# Patient Record
Sex: Female | Born: 1985 | Hispanic: Yes | Marital: Single | Smoking: Never smoker
Health system: Southern US, Community
[De-identification: ages and names within clinical notes are randomized; demographics above are authoritative.]

## PROBLEM LIST (undated history)

## (undated) ENCOUNTER — Inpatient Hospital Stay (HOSPITAL_COMMUNITY): Payer: Self-pay

## (undated) DIAGNOSIS — Z789 Other specified health status: Secondary | ICD-10-CM

## (undated) DIAGNOSIS — R195 Other fecal abnormalities: Secondary | ICD-10-CM

## (undated) DIAGNOSIS — O3430 Maternal care for cervical incompetence, unspecified trimester: Secondary | ICD-10-CM

## (undated) DIAGNOSIS — R109 Unspecified abdominal pain: Secondary | ICD-10-CM

## (undated) DIAGNOSIS — G43909 Migraine, unspecified, not intractable, without status migrainosus: Secondary | ICD-10-CM

## (undated) DIAGNOSIS — R87629 Unspecified abnormal cytological findings in specimens from vagina: Secondary | ICD-10-CM

## (undated) DIAGNOSIS — K625 Hemorrhage of anus and rectum: Secondary | ICD-10-CM

## (undated) DIAGNOSIS — R87619 Unspecified abnormal cytological findings in specimens from cervix uteri: Secondary | ICD-10-CM

## (undated) DIAGNOSIS — Z8619 Personal history of other infectious and parasitic diseases: Secondary | ICD-10-CM

## (undated) DIAGNOSIS — IMO0002 Reserved for concepts with insufficient information to code with codable children: Secondary | ICD-10-CM

## (undated) HISTORY — DX: Unspecified abdominal pain: R10.9

## (undated) HISTORY — DX: Migraine, unspecified, not intractable, without status migrainosus: G43.909

## (undated) HISTORY — DX: Unspecified abnormal cytological findings in specimens from vagina: R87.629

## (undated) HISTORY — DX: Reserved for concepts with insufficient information to code with codable children: IMO0002

## (undated) HISTORY — PX: TONSILLECTOMY: SUR1361

## (undated) HISTORY — PX: DILATION AND CURETTAGE OF UTERUS: SHX78

## (undated) HISTORY — PX: BREAST CYST EXCISION: SHX579

## (undated) HISTORY — DX: Other fecal abnormalities: R19.5

## (undated) HISTORY — DX: Hemorrhage of anus and rectum: K62.5

## (undated) HISTORY — DX: Personal history of other infectious and parasitic diseases: Z86.19

## (undated) HISTORY — PX: CERVICAL BIOPSY: SHX590

## (undated) HISTORY — PX: BREAST SURGERY: SHX581

## (undated) HISTORY — DX: Unspecified abnormal cytological findings in specimens from cervix uteri: R87.619

## (undated) HISTORY — DX: Maternal care for cervical incompetence, unspecified trimester: O34.30

---
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
## 2004-01-24 ENCOUNTER — Ambulatory Visit (HOSPITAL_COMMUNITY): Admission: AD | Admit: 2004-01-24 | Discharge: 2004-01-24 | Payer: Self-pay | Admitting: Obstetrics and Gynecology

## 2004-01-25 ENCOUNTER — Ambulatory Visit (HOSPITAL_COMMUNITY): Admission: AD | Admit: 2004-01-25 | Discharge: 2004-01-25 | Payer: Self-pay | Admitting: Obstetrics and Gynecology

## 2004-01-29 ENCOUNTER — Ambulatory Visit (HOSPITAL_COMMUNITY): Admission: RE | Admit: 2004-01-29 | Discharge: 2004-01-29 | Payer: Self-pay | Admitting: Obstetrics and Gynecology

## 2004-02-12 ENCOUNTER — Inpatient Hospital Stay (HOSPITAL_COMMUNITY): Admission: EM | Admit: 2004-02-12 | Discharge: 2004-02-14 | Payer: Self-pay | Admitting: *Deleted

## 2004-02-14 ENCOUNTER — Ambulatory Visit (HOSPITAL_COMMUNITY): Admission: AD | Admit: 2004-02-14 | Discharge: 2004-02-15 | Payer: Self-pay | Admitting: Obstetrics and Gynecology

## 2004-02-18 HISTORY — PX: CERVICAL CERCLAGE: SHX1329

## 2004-04-03 ENCOUNTER — Ambulatory Visit (HOSPITAL_COMMUNITY): Admission: AD | Admit: 2004-04-03 | Discharge: 2004-04-03 | Payer: Self-pay | Admitting: Obstetrics and Gynecology

## 2004-04-04 ENCOUNTER — Ambulatory Visit (HOSPITAL_COMMUNITY): Admission: AD | Admit: 2004-04-04 | Discharge: 2004-04-04 | Payer: Self-pay | Admitting: Obstetrics & Gynecology

## 2004-04-30 ENCOUNTER — Ambulatory Visit (HOSPITAL_COMMUNITY): Admission: AD | Admit: 2004-04-30 | Discharge: 2004-04-30 | Payer: Self-pay | Admitting: Obstetrics and Gynecology

## 2004-05-07 ENCOUNTER — Ambulatory Visit (HOSPITAL_COMMUNITY): Admission: RE | Admit: 2004-05-07 | Discharge: 2004-05-07 | Payer: Self-pay | Admitting: Obstetrics and Gynecology

## 2004-05-14 ENCOUNTER — Observation Stay (HOSPITAL_COMMUNITY): Admission: AD | Admit: 2004-05-14 | Discharge: 2004-05-15 | Payer: Self-pay | Admitting: Obstetrics and Gynecology

## 2004-05-20 ENCOUNTER — Observation Stay (HOSPITAL_COMMUNITY): Admission: RE | Admit: 2004-05-20 | Discharge: 2004-05-20 | Payer: Self-pay | Admitting: Obstetrics and Gynecology

## 2004-05-23 ENCOUNTER — Ambulatory Visit (HOSPITAL_COMMUNITY): Admission: AD | Admit: 2004-05-23 | Discharge: 2004-05-24 | Payer: Self-pay | Admitting: Obstetrics and Gynecology

## 2004-05-27 ENCOUNTER — Ambulatory Visit (HOSPITAL_COMMUNITY): Admission: RE | Admit: 2004-05-27 | Discharge: 2004-05-27 | Payer: Self-pay | Admitting: Obstetrics & Gynecology

## 2004-05-28 ENCOUNTER — Observation Stay (HOSPITAL_COMMUNITY): Admission: RE | Admit: 2004-05-28 | Discharge: 2004-05-29 | Payer: Self-pay | Admitting: Obstetrics and Gynecology

## 2004-05-30 ENCOUNTER — Ambulatory Visit (HOSPITAL_COMMUNITY): Admission: AD | Admit: 2004-05-30 | Discharge: 2004-05-30 | Payer: Self-pay | Admitting: Obstetrics and Gynecology

## 2004-06-03 ENCOUNTER — Inpatient Hospital Stay (HOSPITAL_COMMUNITY): Admission: AD | Admit: 2004-06-03 | Discharge: 2004-06-05 | Payer: Self-pay | Admitting: Obstetrics and Gynecology

## 2005-03-14 ENCOUNTER — Encounter (INDEPENDENT_AMBULATORY_CARE_PROVIDER_SITE_OTHER): Payer: Self-pay | Admitting: General Surgery

## 2005-03-14 ENCOUNTER — Observation Stay (HOSPITAL_COMMUNITY): Admission: RE | Admit: 2005-03-14 | Discharge: 2005-03-15 | Payer: Self-pay | Admitting: General Surgery

## 2006-01-30 ENCOUNTER — Emergency Department (HOSPITAL_COMMUNITY): Admission: EM | Admit: 2006-01-30 | Discharge: 2006-01-30 | Payer: Self-pay | Admitting: Emergency Medicine

## 2007-07-21 ENCOUNTER — Other Ambulatory Visit: Admission: RE | Admit: 2007-07-21 | Discharge: 2007-07-21 | Payer: Self-pay | Admitting: Obstetrics and Gynecology

## 2009-10-25 ENCOUNTER — Other Ambulatory Visit: Admission: RE | Admit: 2009-10-25 | Discharge: 2009-10-25 | Payer: Self-pay | Admitting: Obstetrics and Gynecology

## 2010-07-05 NOTE — H&P (Signed)
[name], [name]                ACCOUNT NO.:  [id number]   MEDICAL RECORD NO.:  [id number]          PATIENT TYPE:  INP   LOCATION:  LDR3                          FACILITY:  [facility]   PHYSICIAN:  Tilda Burrow, M.D. DATE OF BIRTH:  Jul 10, 1985   DATE OF ADMISSION:  06/03/2004  DATE OF DISCHARGE:  LH                                HISTORY & PHYSICAL   REASON FOR ADMISSION:  Active labor.   HISTORY OF PRESENT ILLNESS:  [name] is a 25 year old gravida 7 para 1,  abortus 5, premature 1, living 1 who was admitted at approximately 7:30 in  active labor.  Medical history is positive for abnormal Paps and incompetent  cervix.  Surgical history is positive for some cosmetic surgery, surgery on  her big toe, tonsils and adenoids, and also a cerclage for this pregnancy.   ALLERGIES:  SHE IS ALLERGIC TO Prudy Feeler.   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  UDS is negative.  Serology is nonreactive.  AFP is within normal  limits.  GBS is also positive.  GC and chlamydia:  I will get the results  and have them forwarded from the office.  I am sure they are negative, but I  will have those sent over to confirm.   PHYSICAL EXAMINATION:  This morning cervix is 4 cm, 100% effaced.  Mucous  membranes are bulging.  Presenting part is 0 station.  Contractions are  every 2 minutes apart.  Fetal heart rate is strong and regular with  accelerations.  No decelerations noted.   PLAN:  We are going to admit and expect vaginal delivery.  Also, start  ampicillin 2 g IV and 1 g q.4 h. for GBS prophylaxis.   MEDICATIONS:  The patient was taking Valtrex for HSV suppression, prenatal  vitamins, and Brethine prior to 36 weeks.      DL/MEDQ  D:  04/54/0981  T:  06/03/2004  Job:  [id number]

## 2010-07-05 NOTE — H&P (Signed)
NAMEAISHIA, [name]                ACCOUNT NO.:  [id number]   MEDICAL RECORD NO.:  [id number]          PATIENT TYPE:  AMB   LOCATION:  DAY                           FACILITY:  [facility]   PHYSICIAN:  Jerolyn Shin C. Katrinka Blazing, M.D.   DATE OF BIRTH:  1985-02-19   DATE OF ADMISSION:  03/13/2005  DATE OF DISCHARGE:  LH                                HISTORY & PHYSICAL   HISTORY OF PRESENT ILLNESS:  [name] is a 25 year old female with  history of chronic hemorrhoid disease.  She has had recurrent episodes of  rectal bleeding.  She has also had episodes of recurrent hemorrhoidal  thrombosis.  She has difficulty with constipation.  She has sought  hemorrhoidectomy and this will be done on January 25.   PAST MEDICAL HISTORY:  No medical illnesses.   MEDICATIONS:  No chronic medications.   PAST SURGICAL HISTORY:  1.  Foot surgery.  2.  Breast surgery for benign disease.   PHYSICAL EXAMINATION:  VITAL SIGNS:  Blood pressure 128/88, pulse 64,  respirations 18, weight 160 pounds.  HEENT:  Unremarkable.  NECK:  Supple.  No JVD, bruit, adenopathy or thyromegaly.  CHEST:  Clear to auscultation.  HEART:  Regular rate and rhythm without murmurs, rubs or gallops.  ABDOMEN:  Soft, nontender, no masses.  RECTAL:  Tight anal sphincter.  Internal and external hemorrhoids.  Apparent  posterior anal fissure.  EXTREMITIES:  No cyanosis, clubbing or edema.  NEUROLOGIC:  No focal motor, sensory or cerebellar deficits.   IMPRESSION:  1.  Hemorrhoid disease.  2.  Posterior anal fissure.   PLAN:  Hemorrhoidectomy and possible internal sphincterotomy.      Dirk Dress. Katrinka Blazing, M.D.  Electronically Signed     LCS/MEDQ  D:  03/13/2005  T:  03/13/2005  Job:  [id number]   cc:   Tilda Burrow, M.D.  Fax: [phone]

## 2010-07-05 NOTE — Consult Note (Signed)
[facility]  Patient:    [name], [name] Visit Number: [id number] MRN: [id number]          Service Type: EMS Location: ED Attending Physician:  Hilario Quarry Dictated by:   Turner Daniels, M.D. Proc. Date: 08/26/01 Admit Date:  08/26/2001 Discharge Date: 08/26/2001                            Consultation Report  HISTORY OF PRESENT ILLNESS:  [name] is a 25 year old African-American female gravida 1, para 0 well known to me from the office.  She had a quantitative hCG a week ago of 97.  It was only in the mid 400s three days ago and I saw her in the office yesterday for a heavy bleeding episode.  My impression was that it was not rising appropriately.  This probably represented a miscarriage or nonviable pregnancy.  She comes in today again complaining of heavy bleeding and cramping and her hCG is 344.  Her H&H is 12.5 and 36.6.  She says the cramping and bleeding has subsided now.  PHYSICAL EXAMINATION  PELVIC:  There is no blood in the vault.  The cervical os is thick, closed, and firm.  The uterus is normal size.  There does not appear to be any infection.  IMPRESSION:  Completed spontaneous abortion.  PLAN:  The patient is given a prescription for Tylox for any ongoing pain and bleeding.  We will see her back in the office on Monday to repeat hCG just to ensure this does not represent an ectopic pregnancy but that does not seem to be the case at the present time with the hCG levels we are dealing with. Talked to her regarding a D&C, but felt like with her cervix being closed and firm and no active bleeding that that would not be in her best interest for future pregnancies.  As a result, she was given a prescription for Tylox and we will see her in the office Monday. Dictated by:   Turner Daniels, M.D. Attending Physician:  Hilario Quarry DD:  08/26/01 TD:  08/30/01 Job: [id number]

## 2010-07-05 NOTE — Op Note (Signed)
[name], [name]                ACCOUNT NO.:  [id number]   MEDICAL RECORD NO.:  [id number]          PATIENT TYPE:  AMB   LOCATION:  DAY                           FACILITY:  [facility]   PHYSICIAN:  Jerolyn Shin C. Katrinka Blazing, M.D.   DATE OF BIRTH:  1985-05-26   DATE OF PROCEDURE:  03/14/2005  DATE OF DISCHARGE:                                 OPERATIVE REPORT   PREOPERATIVE DIAGNOSIS:  Hemorrhoid disease with anal fissure.   POSTOPERATIVE DIAGNOSIS:  Hemorrhoid disease with anal fissure.   PROCEDURE:  Hemorrhoidectomy with internal anal sphincterotomy.   SURGEON:  Dr. Katrinka Blazing.   DESCRIPTION:  Under spinal anesthesia, the patient was placed in prone  position. She was prepped and draped in a sterile field. Digital examination  was carried out. Small sponge was placed in the anal canal. 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 She had large hemorrhoidal complex adjacent to this. She also had  a left anterior hemorrhoidal group with large external hemorrhoid. There was  a very small right lateral hemorrhoidal group that was felt not to be an  operative candidate. The hemorrhoidal tissue in the posterior aspect with  high ligated with 2-0 chromic. Using electrocautery, the bed of the fissure  was excised down to the muscle. Partial internal sphincterotomy was through  the bed of the fissure. Mucosa was then closed using running locking 2-0  chromic. The left anterior group was then evaluated. Apical stitch of 2-0  chromic was placed. The hemorrhoidal complex was excised, taking care not to  injure the sphincter muscle. Hemostasis was achieved, and the mucosa was  closed with running locking 2-0 chromic. It was felt that the tissue did not  need to be excised. Local infiltration with 0.5% Marcaine with epinephrine  was carried out using 30 cc. The patient tolerated the procedure well. The  sponge was removed and accounted for. She was transferred to  a bed and taken  to the post-anesthesia care unit for monitoring.      Dirk Dress. Katrinka Blazing, M.D.  Electronically Signed     LCS/MEDQ  D:  03/14/2005  T:  03/14/2005  Job:  [id number]

## 2010-07-05 NOTE — Discharge Summary (Signed)
[name], [name]                ACCOUNT NO.:  [id number]   MEDICAL RECORD NO.:  [id number]          PATIENT TYPE:  OIB   LOCATION:  A415                          FACILITY:  [facility]   PHYSICIAN:  Tilda Burrow, M.D. DATE OF BIRTH:  1985-11-02   DATE OF ADMISSION:  05/28/2004  DATE OF DISCHARGE:  04/12/2006LH                                 DISCHARGE SUMMARY   REASON FOR ADMISSION:  Observation overnight x 18 hours.   HOSPITAL SUMMARY:  This 25 year old female, gravida 2, para 1, at 34 weeks 6  days, recently status post cerclage removal for suspected cervical  incompetency with long history of preterm labor contractions, was seen in  labor and delivery to rule out early labor.  She was admitted 3 o'clock.  The cervix remained 3 cm. when checked 2 hours later.  Due to transportation  and distance from the hospital and possibility that she might go ahead and  progress, being only two days status post cerclage removal, we kept her  overnight.  Despite ambulation, she did not progress, and contractions  completely ceased.   She is discharged home at 9 a.m. on May 29, 2004, and will follow up in  one week in our office.  She is given prescription for Vicodin.  She already  has Ambien at home.  She will be allowed to labor if she comes in again.      JVF/MEDQ  D:  05/29/2004  T:  05/29/2004  Job:  [id number]

## 2010-07-05 NOTE — H&P (Signed)
NAMESKARLETT, Miranda                ACCOUNT NO.:  [id number]   MEDICAL RECORD NO.:  [id number]          PATIENT TYPE:  OIB   LOCATION:  LDR1                          FACILITY:  [facility]   PHYSICIAN:  Tilda Burrow, M.D. DATE OF BIRTH:  25-Oct-1985   DATE OF ADMISSION:  05/20/2004  DATE OF DISCHARGE:  LH                                HISTORY & PHYSICAL   REASON. FOR ADMISSION:  Pregnancy at 33 weeks and 4 days, premature uterine  contraction.   HISTORY OF PRESENT ILLNESS:  [name] is a 25 year old, gravida 7, para 1 with  an EDC of Jul 04, 2004, with a cerclage in for incompetent cervix, who  presents with regular uterine contractions, 2 to 5 minutes apart at 6:30  a.m.   PAST MEDICAL HISTORY:  Positive for abnormal Paps.   PAST SURGICAL HISTORY:  Positive for some cosmetic surgery, cervical biopsy,  tonsils and adenoids.   ALLERGIES:  She is allergic to __________.   SOCIAL HISTORY:  She is single. Her mother and her boyfriend are present and  supportive.   Prenatal course has been complicated by incompetent cervix, which required a  [employer] procedure cerclage.  HSV 1 and 2 is positive. She is on  suppression at present time, blood type is O positive, UDS is negative. HIV  is nonreactive, HSV, again is positive, serology is nonreactive, GC and  chlamydia are negative. Pap is normal, AFP normal, GBS is positive.   PHYSICAL EXAMINATION:  Cervix is a fingertip, it is long. There is no stress  on the suture in presenting part of fetal heart tone is in the 140s, 150s,  strong and irregular, no decels. The patient has responded well to subcu  terbutaline and contractions have spaced out to irregular.   PLAN:  We are going to consult Dr. Emelda Fear and continue tocolytics.      DL/MEDQ  D:  16/11/9602  T:  05/20/2004  Job:  [id number]   cc:   [facility]

## 2010-07-05 NOTE — H&P (Signed)
[name], [name]                ACCOUNT NO.:  [id number]   MEDICAL RECORD NO.:  [id number]          PATIENT TYPE:  INP   LOCATION:  A415                          FACILITY:  [facility]   PHYSICIAN:  Tilda Burrow, M.D. DATE OF BIRTH:  [birth year]   DATE OF ADMISSION:  02/12/2004  DATE OF DISCHARGE:  LH                                HISTORY & PHYSICAL   ADMISSION DIAGNOSIS:  Pregnancy, 19 weeks, 5 days.  Pelvic pressure.  History of pelvic pressure, rule out cervical incompetence.  Rule out  preterm labor.   HISTORY:  This 25 year old female gravida 7, para 1, AB 5, with one prior  successful pregnancy, delivered at [redacted] weeks gestation last year after a  challenging second trimester, is admitted for the first time to [facility] this pregnancy after presenting for the second time on Christmas  day, complaining of pelvic pressure, sharp lower back pain and mid back dull  ache, like when my period is coming on.  She has been followed through the  pregnancy and to date, the pregnancy has been notable for ultrasound  evaluation of the cervix, which on November 28th suggested a 3.7 cm long  cervix with complete previa suspected.  One week later, the ultrasound  suspected marginal previa with no bleeding having occurred.  She had a  routine fetal survey on February 09, 2004, transabdominal evaluation  suggested a cervix length of 3.3 cm.  Exam today on February 12, 2004  suggested a short cervical length with cervix 1 cm dilated, somewhat tubular  in character.  The possibility of cervical incompetency is considered.   Review of her old prenatal records indicate that she had a cervix length of  1.6 cm at 27 weeks with the last pregnancy.  We were able to maintain the  pregnancy until [redacted] weeks gestation.  She was transferred at least twice to  [facility] for management of the preterm concerns.  Review  of the prior records indicates that she bounced around a lot with  her last  pregnancy, once being seen at [facility], when she was evaluated  there.  She had multiple visits for preterm uterine contractions and  suspected preterm labor.  Cervix remained most of the pregnancy at 1 cm with  dilation of 90-100% effacement, -1 station with posteriorly oriented cervix.  Lower uterine segment was dilated as early as 26 weeks.   PAST MEDICAL HISTORY:  1.  History of abnormal Pap.  2.  She also had a history of overdose early in her pregnancy last year.  3.  She has a history of use of anxiolytics, not currently any being used.   PAST SURGICAL HISTORY:  1.  Third nipple removed.  2.  Tonsillectomy and adenoidectomy.  3.  Big toenail removed.  4.  Cervical biopsy for Pap smear evaluation.   She reports an allergy to [facility].  It is felt that this is perhaps a reaction  to her earlier overdose.   PHYSICAL EXAMINATION:  GENERAL:  A slim, petite Caucasian female alert and  oriented x 3.  She appears to be resting comfortably.  External monitoring  shows several hours of monitoring with no contractions.  With having the  patient turned to her side, the monitor suggests a contraction, but this may  be simply artifact related to the movement.  PELVIC:  Cervical exam shows no bleeding on the glove but shows the cervix  to be 1 cm, 50%, posteriorly oriented cervix, which is quite firm in  texture.   IMPRESSION:  Pregnancy, 19 weeks, 4 days.  Pelvic pressure and back  discomfort suggestive of gynecologic origin.  History suspicious for preterm  labor, possibly a history of preterm cervical changes with last pregnancy.   PLAN:  Will admit today and will obtain transvaginal ultrasound to see if  cervix length has changed (was noted at 3.3 cm on transabdominal scan on  February 09, 2004).  Will give consideration to [employer] cerclage.   ADDENDUM:  Blood type is O+.  Initial urine drug screen negative.  Hemoglobin 13, hematocrit 40.  Hepatitis, HIV, RPR all  negative.  She has a  history of HSV II antibiotics as well as a history of group B strep  positive, prior pregnancy.  Her partner is [employer].  This is her  second child with him.  She plans to breast-feed and bottle-supplement if  the pregnancy is successful.     John   JVF/MEDQ  D:  02/12/2004  T:  02/12/2004  Job:  [id number]

## 2010-07-05 NOTE — H&P (Signed)
[name], [name]                ACCOUNT NO.:  [id number]   MEDICAL RECORD NO.:  [id number]          PATIENT TYPE:  OIB   LOCATION:  A415                          FACILITY:  [facility]   PHYSICIAN:  Lazaro Arms, M.D.   DATE OF BIRTH:  [birth year]   DATE OF ADMISSION:  05/23/2004  DATE OF DISCHARGE:  LH                                HISTORY & PHYSICAL   CHIEF COMPLAINT:  Fall at home, uterine tenderness, uterine contractions.   HISTORY OF PRESENT ILLNESS:  [name] is a gravida 7, para 1, AB 5, living 1,  with an EDC of Jul 04, 2004, based on last menstrual period and first  trimester ultrasound, placing her at 53 weeks' gestation.  [name]  pregnancy has been complicated by cervical incompetency with a [employer]  cerclage placed in her early second trimester.  She has been having preterm  contractions for several months now.  The cerclage is still in place.  History of the night's activities per [name], her mother and her boyfriend  is that [name] was on the couch.  She stood up and fell down, hitting the  right side of her abdomen on the coffee table.  Her mom and boyfriend said  she had jerking motions.  [name]'s mom ran to the kitchen to get a spoon to  put in her mouth because she thought she was having a seizure.  When she got  back, [name] was beginning to wake up.  They carried her to the car and  brought her straight to the hospital.  [name]'s memory of this is standing  up from the couch, and her next memory is being toted to the car by her mom  and her boyfriend.  They state that she was confused the entire way to the  hospital, which was probably five to 10 minutes.  Upon arrival to the unit,  [name] was alert and oriented x3.  She is having regular 60-70 second  uterine contractions about every seven to eight minutes or so.  When she  first came, her abdomen was soft and had minimal pain in between the  contractions, and the fetal heart rate was in the 140s-150s with  average  long-term variability, no qualifying accelerations.  After being here for  about 20 minutes, the fetal heart rate abruptly went to 0 to minimal long-  term variability, and her uterus is irritable in between the contractions.  She was given O2 at 8 L/min. via nonrebreather mask.  Acoustic stimulation  was given, and the heart rate once again developed average long-term  variability.  There are some very mild variable decelerations, not late  decelerations, noted.  There are some 10 x 10 accelerations but still no  qualifying reactive accelerations.  Vital signs are within normal limits.  Blood pressure is not elevated.  Urine dip was negative for everything,  including ketones.  [name] says that she has been eating and drinking a lot  today and going to the bathroom a lot.  Her uterus is tender to touch where  she fell.  She says that  she has just a general feeling of soreness  throughout her uterus.  Her uterus is soft to the touch.  There is no  apparent obvious bruising.   PELVIC EXAMINATION:  The cerclage is in place, although I can palpate more  of the stitch than on previous exams, less cervix.  A sterile speculum exam  did not offer much more information, just because it was difficult to really  visualize.  The cervix looked very short.   IMPRESSION:  1.  Intrauterine pregnancy at 34 weeks.  2.  Syncopal episode versus seizure.  3.  Questionable abruption.  4.  Fetal heart rate at this point remains reassuring.   Dr. Despina Hidden was contacted and given the above information.  The plan is to draw  abruption labs.  Will hold off on terbutaline for the contractions so that  we do not mask any placental injury.  Keep her on the fetal monitor  throughout the night.  She can have Lortab for pain.  Will do a head CT in  the morning and observe her overnight.      FC/MEDQ  D:  05/24/2004  T:  05/24/2004  Job:  [id number]

## 2010-07-05 NOTE — H&P (Signed)
[name], Miranda                ACCOUNT NO.:  [id number]   MEDICAL RECORD NO.:  [id number]          PATIENT TYPE:  OIB   LOCATION:  A415                          FACILITY:  [facility]   PHYSICIAN:  Tilda Burrow, M.D. DATE OF BIRTH:  27-Sep-1985   DATE OF ADMISSION:  05/07/2004  DATE OF DISCHARGE:  LH                                HISTORY & PHYSICAL   HISTORY OF PRESENT ILLNESS:  [name] is a 25 year old, gravida 7, para 1,  abortus 5 who is at 31 weeks and 5 days. EDC is May 18, who presented at 6  a.m. this morning having contractions. States that she was up most of the  night and comfortable, but got really uncomfortable around 3 a.m. and at  that time, did not resolve with bed rest or hydration. At 5 o'clock, decided  to come into the hospital.   PAST MEDICAL HISTORY:  Positive for abnormal Paps.   PAST SURGICAL HISTORY:  Positive for cosmetic surgery, tonsils and adenoids,  cervix biopsy, although she had a cerclage stitch placed in this pregnancy.   ALLERGIES:  She is allergic to [facility].   CURRENT MEDICATIONS:  She is taking prenatal vitamins.   Prenatal course has been complicated by incompetent cervix, which was  cerclaged, was placed on December 27. She has had multiple hospitalizations  for preterm contractions during this pregnancy and she presents today for  same.   PHYSICAL EXAMINATION:  Vital signs are stable. Cervix is closed and long  stitches intact. There is some mild lower uterine incision bulging and she  is having regular contractions.   PLAN:  We are going to give some subcu terbutaline, followed by p.o.  terbutaline. Also, we are going to start her on HSV suppression at present  time due to the probability that she will go into labor before her 36 week  suppression regime. We are going to keep her until lunch time to see if  these contractions stay resolved and plan for her to be discharged on  Brethine 2.5 p.o. q. 4 p.r.n. contractions and Valtrex 500  p.o. q. day. She  is to follow-up in the office Thursday or Friday.      DL/MEDQ  D:  16/11/9602  T:  05/07/2004  Job:  [id number]

## 2010-07-05 NOTE — Discharge Summary (Signed)
   [name], [name]                          ACCOUNT NO.:  [id number]   MEDICAL RECORD NO.:  [id number]                   PATIENT TYPE:  INP   LOCATION:  A417                                 FACILITY:  [facility]   PHYSICIAN:  Lazaro Arms, M.D.                DATE OF BIRTH:  03/11/1985   DATE OF ADMISSION:  10/25/2002  DATE OF DISCHARGE:  10/28/2002                                 DISCHARGE SUMMARY   DISCHARGE DIAGNOSES:  1. Status post magnesium sulfate tocolysis, preterm labor.  2. Intrauterine pregnancy at [redacted] weeks gestation.   HOSPITAL COURSE:  The patient was admitted for recurrent preterm labor.  She  was placed on magnesium sulfate tocolysis for 36 hours.  Also, placed on  Indocin for 48 hours.  She did well with that and had no further change in  her cervix.  No rupture of membranes, no bleeding.  She was placed on  terbutaline 24 hours without significant uterine activity and as result was  discharged home on the morning of October 28, 2002.   FOLLOW UP:  To follow up in the office on Wednesday.   DISCHARGE INSTRUCTIONS:  She will continue to take her terbutaline 5 mg  q.4h.                                                 Lazaro Arms, M.D.    [name]  D:  10/28/2002  T:  10/28/2002  Job:  [id number]

## 2010-07-05 NOTE — H&P (Signed)
   NAME:  [name], [name]                          ACCOUNT NO.:  [id number]   MEDICAL RECORD NO.:  [id number]                   PATIENT TYPE:  OBV   LOCATION:  A414                                 FACILITY:  [facility]   PHYSICIAN:  Lazaro Arms, M.D.                DATE OF BIRTH:  [birth year]   DATE OF ADMISSION:  10/25/2002  DATE OF DISCHARGE:                                HISTORY & PHYSICAL   CHIEF COMPLAINT:  Contractions x several hours.   HISTORY OF PRESENT ILLNESS:  [name] is an 25 year old, gravida 3, para 0, AB  2 with an EDC of January 07, 2003 based on last menstrual period and  correlating ultrasounds placing her at 29 weeks, 3 days gestation. Her  pregnancy course has been complicated by preterm labor with cervical change  at approximately 27 weeks with a cervix 1 cm, 80-90% effaced, -1 to 0  station. She was in fact sent to [facility] anticipating delivery but  was sent home. She has been on p.o. Brethine for approximately a week and a  half and broke through this afternoon. She did not respond to a subcu dose  of terbutaline and in fact felt that the contractions worsened. For this  reason, she was given a 6 g bolus of magnesium sulfate and will be  maintained on 3 g an hour dosage. Her cervix upon admission per RN was  unchanged calling it fingertip 80-90%, -1 to 0 station. Fetal heart rate is  reactive without decelerations at this time. The mag bolus has been  completed for approximately five minutes and contractions are spaced out,  the last two were seven minutes apart. The patient is complaining of slight  tenderness in her right upper quadrant so we will do a CBC and check liver  functions just to be safe.   IMPRESSION:  Intrauterine pregnancy at 29 weeks, 3 days, preterm labor.   PLAN:  Magnesium sulfate tocolysis, rule out hepatic or gallbladder  malfunction. The patient has already received a course of betamethasone.     [name], C.N.M.           Lazaro Arms, M.D.    FC/MEDQ  D:  10/25/2002  T:  10/25/2002  Job:  [id number]

## 2010-07-05 NOTE — Consult Note (Signed)
[name], [name]                ACCOUNT NO.:  [id number]   MEDICAL RECORD NO.:  [id number]          PATIENT TYPE:  OIB   LOCATION:  A415                          FACILITY:  [facility]   PHYSICIAN:  Tilda Burrow, M.D. DATE OF BIRTH:  1985/04/06   DATE OF CONSULTATION:  05/24/2004  DATE OF DISCHARGE:                                   CONSULTATION   [name] was observed throughout the night and was given IV fluids, IV Nubain  x1, some Ambien.  She did calm down in her uterus and her pain.  We  monitored her all night.  She continued to have some uterine irritability  some mild contractions, however nothing major.  Her head CT this morning was  negative.  She had a biophysical profile which was 6/8.  The heart rate  maintained average long-term variability with accelerations present from  time to time.  The fetus was moving around a whole lot.  Placenta looked  fine on ultrasound.   IMPRESSION:  1.  Intrauterine pregnancy at  34 weeks.  2.  Probable syncopal episode.  3.  No evidence of abruption.   We will discharge her home on Lortab 5/500 mg #30 for pain in her side where  she the table and Ambien 10 mg #30 for sleep.  She is going to return on  Monday to the hospital for a biophysical profile before her appointment at  the office.      FC/MEDQ  D:  05/24/2004  T:  05/24/2004  Job:  [id number]   cc:   Lorin Picket A. Gerda Diss, MD  [address]  [place]  [place] [address]  Fax: [phone]

## 2010-07-05 NOTE — Op Note (Signed)
NAMEDEKOTA, KIRLIN                ACCOUNT NO.:  [id number]   MEDICAL RECORD NO.:  [id number]          PATIENT TYPE:  INP   LOCATION:  LDR3                          FACILITY:  [facility]   PHYSICIAN:  Tilda Burrow, M.D. DATE OF BIRTH:  1985-05-22   DATE OF PROCEDURE:  06/03/2004  DATE OF DISCHARGE:                                  PROCEDURE NOTE   ONSET OF LABOR:  June 03, 2004, at 5:30 a.m.   DATE OF DELIVERY:  June 03, 2004, at 8:55 a.m.   LENGTH OF FIRST-STAGE LABOR:  Three hours and 15 minutes.   LENGTH OF SECOND-STAGE LABOR:  Ten minutes.   LENGTH OF THIRD-STAGE LABOR:  Five minutes.   DELIVERY NOTE:  Esli has spontaneous vaginal delivery of a viable female  infant with Apgars of 9 and 9.  Upon delivery of head, shoulders  spontaneously delivered.  Infant was thoroughly suctioned and dried.  Cord  clamped and cut.  Infant to mother's abdomen for newborn care.  Upon  inspection, perineum was noted to be intact.  Cord blood gas was obtained.  Upon inspection, three-vessel cord was noted.  Cord blood was obtained and  sent to the lab.  Third stage of labor was actively managed with 20 units of  Pitocin at a rapid rate.  Placenta delivered via Tomasa Blase mechanism.  Membranes upon inspection are noted to be intact.  Estimated blood loss was  approximately 250 cc.  Infant and mother were stabilized and transferred out  to the postpartum unit in stable condition.      DL/MEDQ  D:  81/19/1478  T:  06/03/2004  Job:  [id number]   cc:   Tilda Burrow, M.D.  [address] [place]  [place] [address]  Fax: [phone]   Francoise Schaumann. Halford Chessman  Fax: [phone]

## 2010-07-05 NOTE — Op Note (Signed)
[name], [name]                ACCOUNT NO.:  [id number]   MEDICAL RECORD NO.:  [id number]          PATIENT TYPE:  OIB   LOCATION:  A415                          FACILITY:  [facility]   PHYSICIAN:  Tilda Burrow, M.D. DATE OF BIRTH:  [birth year]   DATE OF PROCEDURE:  04/30/2004  DATE OF DISCHARGE:  04/30/2004                                 OPERATIVE REPORT   [name] had a three-hour observation on April 30, 2004.  She was having some  mild contractions.  She did not want an injectable terbutaline, so I gave  her p.o. Brethine and held her for two hours after the Brethine, and her  uterine activity stopped.  Cervix was soft but cerclage is in place, and she  is essentially closed.  Fetal heart rate looked fine.   IMPRESSION:  1.  Preterm contractions responding well to terbutaline.  2.  Cerclage.      FC/MEDQ  D:  05/02/2004  T:  05/02/2004  Job:  [id number]

## 2010-07-05 NOTE — Op Note (Signed)
[facility]  Patient:    [name], [name] Visit Number: [id number] MRN: [id number]          Service Type: DSU Location: DAY Attending Physician:  Dalia Heading Dictated by:   Franky Macho, M.D. Proc. Date: 07/26/01 Admit Date:  07/26/2001   CC:         Christin Bach, M.D.   Operative Report  PREOPERATIVE DIAGNOSIS:  Inflamed accessory nipple, left.  POSTOPERATIVE DIAGNOSIS:  Inflamed accessory nipple, left.  PROCEDURE:  Excision of accessory left nipple.  SURGEON:  Franky Macho, M.D.  ANESTHESIA:  General.  INDICATIONS FOR PROCEDURE:  The patient is a 25 year old white female who presents with an irritated accessory left nipple. It is along the inframammary fold on the left side. The patient now comes to the operating room for excision of the accessory left nipple. The risks and benefits of the procedure including bleeding, infection, and recurrence were fully explained to the patients mother who gave informed consent.  DESCRIPTION OF PROCEDURE:  The patient was placed in the supine position. After general anesthesia was administered, the left breast was prepped and draped using the usual sterile technique with Betadine.  An accessory left nipple was noted along the mid clavicular line at the inframammary fold. An elliptical incision was made around the nipple and the nipple along with its ductal subcutaneous contents were excised fully without difficulty. Any bleeding was controlled using Bovie electrocautery. The 0.5% Marcaine was instilled into the surrounding wound. The skin was closed using a 4-0 Vicryl subcuticular suture. Steri-Strips and a dry sterile dressing were applied.  All tape and needle counts were correct at the end of the procedure. The patient was awakened and transferred to PACU in stable condition.  COMPLICATIONS:  None.  SPECIMEN:  Accessory left nipple.  ESTIMATED BLOOD LOSS:  Minimal. Dictated by:   Franky Macho, M.D. Attending Physician:  Dalia Heading DD:  07/26/01 TD:  07/27/01 Job: [id number]

## 2010-07-05 NOTE — Discharge Summary (Signed)
[name], [name]                          ACCOUNT NO.:  [id number]   MEDICAL RECORD NO.:  [id number]                   PATIENT TYPE:  OIB   LOCATION:  A414                                 FACILITY:  [facility]   PHYSICIAN:  Langley Gauss, M.D.                DATE OF BIRTH:  28-Nov-1985   DATE OF ADMISSION:  10/30/2002  DATE OF DISCHARGE:  10/30/2002                                 DISCHARGE SUMMARY   OBSTETRICAL OBSERVATION AND TRANSFER SUMMARY   This 25 year old gravida 3, para 0, first trimester abortus 2 para 0 at [redacted]  weeks gestation who presents to [facility] complaining of  contractions all day long with increasing frequency and intensity during the  hour prior to presentation.  The patient called labor and delivery at which  time she was advised to come in for evaluation. Pertinently the patient had  been attending church services earlier in the day in [place].  With the  onset of uterine contractions she had been transported to [facility] in  [place] by ambulance at which time she was evaluated by a female care  [name] there.  The patient was observed x2 hours duration.  Digital and  sterile speculum examination were performed and the patient was discharged  to home with instructions to follow up with her care [name]. No tocolytics  were administered.  The patient's prenatal course otherwise complicated by  previous hospitalization to [facility] at [redacted] weeks gestation with  preterm labor and premature cervical dilatation.  It appears as though the  patient was dilated about 1 cm.  At that time she did receive 2 doses of IM  betamethasone. The patient subsequently had recurrent preterm labor and was  recently hospitalized at [facility] from September 6 to October 28, 2002 with recurrent preterm labor.  She was discharged to home with what  the patient states are instructions to take p.o. Brethine as needed for  preterm labor. The patient  has not taken any Brethine times several days  duration.   ALLERGIES:  She has no known drug allergies.   PAST MEDICAL HISTORY:  The patient did have a benzodiazepine overdose in  October 2000 requiring an ICU stay.  She had an accessory nipple excised  June 2003.  The patient had previous tonsillectomy.  She does have a history  of HSV, GC, and Chlamydia.  No recurrences during this pregnancy.   SOCIAL HISTORY:  The patient is a nonsmoker, married to a Hotel manager man who  was deployed in [place] and has now returned home but is at [employer] [place].   She is a nonsmoker.   FAMILY HISTORY:  Noncontributory.   PHYSICAL EXAMINATION:  GENERAL: The patient is in no acute distress, but  does appear uncomfortable with uterine contractions on initial examination.  VITAL SIGNS:  Temperature 97.4, pulse 100, respiratory rate 18, blood  pressure 146/81.  HEENT:  Negative.  NECK: No adenopathy. Neck is supple.  Thyroid is nonpalpable.  LUNGS:  Clear.  CARDIOVASCULAR:  Heart is regular rate and rhythm.  ABDOMEN:  Soft and nontender.  A gravid uterus is identified with fundal  height of 30 cm.  Vertex presentation by Leopold's maneuvers.  EXTREMITIES:  Noted to be normal.  PELVIC:  Normal external genitalia.  No lesions or ulcerations identified.  No vaginal bleeding or leakage of fluid.  Cervix 2 cm dilated, 100% effaced,  0 station vertex presentation.  External fetal monitor reveals initial  uterine contractions occurring every 3-4 minutes about 4-5 seconds duration.  Fetal heart rate noted to be in the 150s with accelerations.  No fetal heart  rate decelerations are noted.   ASSESSMENT:  A 30 weeks intrauterine pregnancy, recurrent preterm labor and  premature cervical dilatation.   PLAN:  The patient have tocolysis aggressively at this time.  If stabilized  we will arrange for transport to our facility with a level 3 nursery  availability.  The patient adamantly declines  transfer or evaluation to  [facility] in [place], and prefers [facility].  The patient is  to be treated with betamethasone 12 mg IM.  This will be repeated as she is  at high risk for delivery within the next week and empirically also given  ampicillin 2 gm IV.  After the initial dose of subcu terbutaline the patient  will have tocolysis with magnesium sulfate, started with a 6 gm IV loading  dose followed by 3 grams per hour x2 hours followed by maintenance of 2 gm  per hour.  The patient was noted to have near complete cessation of uterine  activity after the initial subcu injection of subcu terbutaline.  Thus, I  contacted [facility], talked with Dr. Margot Ables and arrangements are  made for transfer by emergent EMS.  During the evaluation period and  awaiting transfer the patient did have some recurrence of uterine activity  requiring a second and third dose of subcu terbutaline.  A third dose  administered just prior to removal of the floor.                                               Langley Gauss, M.D.    DC/MEDQ  D:  10/31/2002  T:  10/31/2002  Job:  [id number]   cc:   [facility]

## 2010-07-05 NOTE — Consult Note (Signed)
NAMECARREEN, MILIUS                ACCOUNT NO.:  [id number]   MEDICAL RECORD NO.:  [id number]          PATIENT TYPE:  OIB   LOCATION:  A415                          FACILITY:  [facility]   PHYSICIAN:  Tilda Burrow, M.D. DATE OF BIRTH:  [birth year]   DATE OF CONSULTATION:  05/30/2004  DATE OF DISCHARGE:  05/30/2004                                   CONSULTATION   HISTORY OF PRESENT ILLNESS:  Kaedyn came into labor and delivery on May 28, 2004 with complaints of contractions.  She was contracting irregularly.  Her cervix was 3 cm, 50% effaced, -1 station.  Fetal heart rate was  reactive.  Starlena refused tocolytics.  She did have her cerclage removed on  May 27, 2004 by Dr. Emelda Fear.  We will plan to observe for labor with  absolutely no plans for augmenting this process. This was discussed with  Kendal Hymen and she is aware of that.  Tocolytics were recommended due to her  early gestational age.  However, as mentioned before, she has refused them.  Dr. Despina Hidden is aware of her being in the hospital.  If it does look like she is  in labor, we will contact the pediatrician to see whether or not she needs  to be transferred to another facility to deliver.  At this time we will just  plan to observe her.  She has been in the hospital now for three or four  hours with no cervical change, so at this point it does not look like  anything is going to be happening, but we will keep her overnight  nonetheless.      FC/MEDQ  D:  05/30/2004  T:  05/30/2004  Job:  [id number]

## 2010-07-05 NOTE — H&P (Signed)
NAMEJANIEL, [name]                ACCOUNT NO.:  [id number]   MEDICAL RECORD NO.:  [id number]          PATIENT TYPE:  OIB   LOCATION:  LDR1                          FACILITY:  [facility]   PHYSICIAN:  Tilda Burrow, M.D. DATE OF BIRTH:  Jan 09, 1986   DATE OF ADMISSION:  05/14/2004  DATE OF DISCHARGE:  03/29/2006LH                                HISTORY & PHYSICAL   TIME OF OBSERVATION ADMISSION:  2105 hours.   HISTORY OF PRESENT ILLNESS:  [name] is a 25 year old gravida 7, para 1,  abortus 5 with a history of fetal losses, who presents at 32 weeks with  cramping and contractions that are unresolved by bed rest and hydration.   PAST MEDICAL HISTORY:  Positive for abnormal Paps.   PAST SURGICAL HISTORY:  Positive for some cosmetic surgery, tonsils and  adenoids and a biopsy of the cervix.   ALLERGIES:  She is allergic to Xanax.   SOCIAL HISTORY:  She is single.   Prenatal course has been complicated by numerous admissions and also  premature cervical dilatation and incompetent cervix, which required a  cerclage being placed.  Blood type is O+.  UDS is negative.  HIV is  nonreactive.  Hepatitis B is negative.  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  Serology is nonreactive.  GBS is also  positive.   PHYSICAL EXAMINATION:  GENERAL:  With one shot of subcu terbutaline, the  patient slept during most of the course of the night without contractions.  With review of her fetal monitoring strip, there is an occasional isolated  contraction that she is unaware of.  She states she does feel better this  morning.  PELVIS:  Cervical exam reveals the cervix is still intact with stitch  intact.  There is no stress on the stitch.  The cervix is closed.  There is  no bulging of the lower uterine segment, presenting part is high in the  pelvis.   PLAN:  Consulted Dr. Emelda Fear.  He was in to see the patient and talk with  the patient.  We are going to discharge her  home.  She is to follow up in  the office in the morning for her second dose of betamethasone, and she  knows she is to follow up with [place] p.r.n. any uterine contractions.      DL/MEDQ  D:  47/82/9562  T:  05/15/2004  Job:  [id number]   cc:   Tilda Burrow, M.D.  [address] [place]  [place] [address]  Fax: [phone]

## 2010-10-29 ENCOUNTER — Other Ambulatory Visit (HOSPITAL_COMMUNITY)
Admission: RE | Admit: 2010-10-29 | Discharge: 2010-10-29 | Disposition: A | Discharge status: Home / Self Care | Payer: Medicaid Other | Source: Ambulatory Visit | Attending: Obstetrics and Gynecology | Admitting: Obstetrics and Gynecology

## 2010-10-29 DIAGNOSIS — Z01419 Encounter for gynecological examination (general) (routine) without abnormal findings: Secondary | ICD-10-CM | POA: Insufficient documentation

## 2010-10-29 DIAGNOSIS — R87810 Cervical high risk human papillomavirus (HPV) DNA test positive: Secondary | ICD-10-CM | POA: Insufficient documentation

## 2011-02-18 HISTORY — PX: ECTOPIC PREGNANCY SURGERY: SHX613

## 2011-03-10 ENCOUNTER — Ambulatory Visit: Admit: 2011-03-10 | Payer: Self-pay | Admitting: Obstetrics and Gynecology

## 2011-03-10 ENCOUNTER — Encounter (HOSPITAL_COMMUNITY): Payer: Self-pay | Admitting: Anesthesiology

## 2011-03-10 ENCOUNTER — Encounter (HOSPITAL_COMMUNITY): Payer: Self-pay

## 2011-03-10 ENCOUNTER — Emergency Department (HOSPITAL_COMMUNITY): Payer: Medicaid Other | Admitting: Anesthesiology

## 2011-03-10 ENCOUNTER — Encounter (HOSPITAL_COMMUNITY): Admission: EM | Disposition: A | Discharge status: Home / Self Care | Payer: Self-pay | Source: Home / Self Care

## 2011-03-10 ENCOUNTER — Ambulatory Visit (HOSPITAL_COMMUNITY)
Admission: EM | Admit: 2011-03-10 | Discharge: 2011-03-10 | Disposition: A | Discharge status: Home / Self Care | Payer: Medicaid Other | Attending: Obstetrics and Gynecology | Admitting: Obstetrics and Gynecology

## 2011-03-10 ENCOUNTER — Other Ambulatory Visit: Payer: Self-pay | Admitting: Obstetrics and Gynecology

## 2011-03-10 DIAGNOSIS — Z01812 Encounter for preprocedural laboratory examination: Secondary | ICD-10-CM | POA: Insufficient documentation

## 2011-03-10 DIAGNOSIS — O00109 Unspecified tubal pregnancy without intrauterine pregnancy: Secondary | ICD-10-CM | POA: Diagnosis present

## 2011-03-10 HISTORY — PX: LAPAROSCOPY FOR ECTOPIC PREGNANCY: SUR765

## 2011-03-10 LAB — CBC
Hemoglobin: 12.8 g/dL (ref 12.0–15.0)
MCH: 30 pg (ref 26.0–34.0)
MCHC: 33.2 g/dL (ref 30.0–36.0)
MCV: 90.4 fL (ref 78.0–100.0)
RDW: 12.9 % (ref 11.5–15.5)

## 2011-03-10 SURGERY — SALPINGECTOMY, UNILATERAL, LAPAROSCOPIC
Anesthesia: General | Laterality: Left | Wound class: Clean

## 2011-03-10 MED ORDER — ROCURONIUM BROMIDE 100 MG/10ML IV SOLN
INTRAVENOUS | Status: DC | PRN
  Administered 2011-03-10: 5 mg via INTRAVENOUS
  Administered 2011-03-10: 35 mg via INTRAVENOUS

## 2011-03-10 MED ORDER — MIDAZOLAM HCL 5 MG/5ML IJ SOLN
INTRAMUSCULAR | Status: DC | PRN
  Administered 2011-03-10: 2 mg via INTRAVENOUS

## 2011-03-10 MED ORDER — LIDOCAINE HCL (CARDIAC) 10 MG/ML IV SOLN
INTRAVENOUS | Status: DC | PRN
  Administered 2011-03-10: 10 mg via INTRAVENOUS

## 2011-03-10 MED ORDER — NEOSTIGMINE METHYLSULFATE 1 MG/ML IJ SOLN
INTRAMUSCULAR | Status: DC | PRN
  Administered 2011-03-10: 2 mg via INTRAVENOUS

## 2011-03-10 MED ORDER — FENTANYL CITRATE 0.05 MG/ML IJ SOLN
INTRAMUSCULAR | Status: DC | PRN
  Administered 2011-03-10 (×2): 50 ug via INTRAVENOUS
  Administered 2011-03-10 (×2): 25 ug via INTRAVENOUS
  Administered 2011-03-10: 100 ug via INTRAVENOUS

## 2011-03-10 MED ORDER — SODIUM CHLORIDE 0.9 % IR SOLN
Status: DC | PRN
  Administered 2011-03-10: 3000 mL

## 2011-03-10 MED ORDER — LIDOCAINE-EPINEPHRINE (PF) 1 %-1:200000 IJ SOLN
INTRAMUSCULAR | Status: DC | PRN
  Administered 2011-03-10: 10 mL

## 2011-03-10 MED ORDER — GLYCOPYRROLATE 0.2 MG/ML IJ SOLN
INTRAMUSCULAR | Status: DC | PRN
  Administered 2011-03-10: .4 mg via INTRAVENOUS

## 2011-03-10 MED ORDER — LACTATED RINGERS IV SOLN
INTRAVENOUS | Status: DC | PRN
  Administered 2011-03-10: 19:00:00 via INTRAVENOUS

## 2011-03-10 MED ORDER — ONDANSETRON HCL 4 MG/2ML IJ SOLN
INTRAMUSCULAR | Status: DC | PRN
  Administered 2011-03-10: 4 mg via INTRAVENOUS

## 2011-03-10 MED ORDER — PROPOFOL 10 MG/ML IV EMUL
INTRAVENOUS | Status: DC | PRN
  Administered 2011-03-10: 20 mg via INTRAVENOUS
  Administered 2011-03-10: 150 mg via INTRAVENOUS

## 2011-03-10 MED ORDER — 0.9 % SODIUM CHLORIDE (POUR BTL) OPTIME
TOPICAL | Status: DC | PRN
  Administered 2011-03-10: 1000 mL

## 2011-03-10 SURGICAL SUPPLY — 40 items
BAG HAMPER (MISCELLANEOUS) ×1 IMPLANT
BLADE SURG SZ11 CARB STEEL (BLADE) ×1 IMPLANT
CATH FOLEY 2WAY SLVR  5CC 14FR (CATHETERS) ×1
CATH FOLEY 2WAY SLVR 5CC 14FR (CATHETERS) IMPLANT
CLOTH BEACON ORANGE TIMEOUT ST (SAFETY) ×1 IMPLANT
COVER LIGHT HANDLE STERIS (MISCELLANEOUS) ×2 IMPLANT
DRESSING COVERLET 3X1 FLEXIBLE (GAUZE/BANDAGES/DRESSINGS) ×3 IMPLANT
ELECT REM PT RETURN 9FT ADLT (ELECTROSURGICAL) ×2
ELECTRODE REM PT RTRN 9FT ADLT (ELECTROSURGICAL) IMPLANT
GLOVE BIOGEL PI IND STRL 7.0 (GLOVE) IMPLANT
GLOVE BIOGEL PI INDICATOR 7.0 (GLOVE) ×2
GLOVE INDICATOR STER SZ 9 (GLOVE) ×1 IMPLANT
GLOVE SS PI 9.0 STRL (GLOVE) ×1 IMPLANT
GOWN STRL REIN 3XL LVL4 (GOWN DISPOSABLE) ×1 IMPLANT
GOWN STRL REIN XL XLG (GOWN DISPOSABLE) ×2 IMPLANT
INST SET LAPROSCOPIC GYN AP (KITS) ×1 IMPLANT
IV NS IRRIG 3000ML ARTHROMATIC (IV SOLUTION) ×1 IMPLANT
KIT ROOM TURNOVER AP CYSTO (KITS) ×1 IMPLANT
MANIFOLD NEPTUNE II (INSTRUMENTS) ×1 IMPLANT
NDL HYPO 18GX1.5 BLUNT FILL (NEEDLE) IMPLANT
NDL INSUFFLATION 14GA 120MM (NEEDLE) IMPLANT
NEEDLE HYPO 18GX1.5 BLUNT FILL (NEEDLE) ×2 IMPLANT
NEEDLE INSUFFLATION 14GA 120MM (NEEDLE) ×2 IMPLANT
NS IRRIG 1000ML POUR BTL (IV SOLUTION) ×1 IMPLANT
PACK PERI GYN (CUSTOM PROCEDURE TRAY) ×1 IMPLANT
SET BASIN LINEN APH (SET/KITS/TRAYS/PACK) ×1 IMPLANT
SET TUBE IRRIG SUCTION NO TIP (IRRIGATION / IRRIGATOR) ×1 IMPLANT
SOL PREP PROV IODINE SCRUB 4OZ (MISCELLANEOUS) ×1 IMPLANT
SOLUTION ANTI FOG 6CC (MISCELLANEOUS) ×1 IMPLANT
STRIP CLOSURE SKIN 1/2X4 (GAUZE/BANDAGES/DRESSINGS) ×1 IMPLANT
STRIP CLOSURE SKIN 1/4X3 (GAUZE/BANDAGES/DRESSINGS) ×1 IMPLANT
SUT VICRYL 0 UR6 27IN ABS (SUTURE) ×1 IMPLANT
SUT VICRYL AB 3-0 FS1 BRD 27IN (SUTURE) ×1 IMPLANT
SYR BULB IRRIGATION 50ML (SYRINGE) ×1 IMPLANT
TRAY FOLEY BAG SILVER LF 14FR (CATHETERS) ×1 IMPLANT
TROCAR Z-THRD FIOS HNDL 11X100 (TROCAR) ×1 IMPLANT
TROCAR Z-THREAD FIOS 5X100MM (TROCAR) ×1 IMPLANT
TROCAR Z-THREAD SLEEVE 11X100 (TROCAR) ×1 IMPLANT
TUBING INSUFFLATION HIGH FLOW (TUBING) ×1 IMPLANT
WARMER LAPAROSCOPE (MISCELLANEOUS) ×1 IMPLANT

## 2011-03-10 NOTE — ED Notes (Signed)
Dr. Ferguson at bedside. 

## 2011-03-10 NOTE — H&P (Signed)
[name] is an 26 y.o. female. She is admitted for a suture left linear salpingostomy to remove the left ectopic pregnancy. The ectopic was diagnosis afternoon in the office where ultrasound shows in ampullary ectopic pregnancy with 5 mm fetal pole with heartbeat. Treatment options have been discussed including methotrexate versus surgical regimens. We have agreed that failure rate for methotrexate is considered high and therefore recommended surgical treatment. Plans are to try to salvage the left tube. The patient is aware that linear salpingostomy is not always effective in on occasion the left tube Must  be removed.  Pertinent Gynecological History: Blood transfusions: none Sexually transmitted diseases: no past history Previous GYN Procedures:    Last pap: normal Date: 10/2009 OB History: G4, P3   Menstrual History: Menarche age : LMP 01/30/2011 No LMP recorded.    No past medical history on file.  No past surgical history on file.  No family history on file.  Social History:  does not have a smoking history on file. She does not have any smokeless tobacco history on file. Her alcohol and drug histories not on file.  Allergies: Allergies not on file   (Not in a hospital admission)  ROS patient has noted dull left lower quadrant pain like something was causing pressure symptoms, normal bowel function  There were no vitals taken for this visit.  Physical Examination: General appearance - alert, well appearing, and in no distress, oriented to person, place, and time, normal appearing weight, anxious and in mild to moderate distress Mouth - mucous membranes moist, pharynx normal without lesions and dental hygiene good Chest - clear to auscultation, no wheezes, rales or rhonchi, symmetric air entry Heart - normal rate and regular rhythm Abdomen - tenderness noted in the left lower quadrant, mild intensity, no rebound Pelvic - VULVA: normal appearing vulva with no masses,  tenderness or lesions, VAGINA: normal appearing vagina with normal color and discharge, no lesions, CERVIX: Evidence of prior LEEP conization of the cervix, UTERUS: anteverted, mobile, as judged by ultrasound, no bimanual exam ADNEXA: normal adnexa in size, nontender and no masses, tenderness left, with no pelvic fluid. Ultrasound shows a live ectopic pregnancy in the distal left tube Legs normal Physical Exam  No results found for this or any previous visit (from the past 24 hour(s)).  No results found. CBC ordered pregnancy test positive last week in the office at 1500  Assessment/Plan:Unruptured left ectopic pregnancy ampullary, with living fetal pole scheduled for laparoscopic linear salpingostomy possible left salpingectomy this evening at 6:30 PM. Patient is n.p.o. having last eaten food at 12:30 PM  technical aspects of procedure reviewed briefly with patient consent obtained verbally to be documented upon return to ED tonight at [facility] 03/10/2011, 4:29 PM

## 2011-03-10 NOTE — Anesthesia Preprocedure Evaluation (Signed)
Anesthesia Evaluation  Patient identified by MRN, date of birth, ID band Patient awake    Reviewed: Allergy & Precautions, H&P , NPO status , Patient's Chart, lab work & pertinent test results  Airway Mallampati: I TM Distance: >3 FB Neck ROM: Full    Dental No notable dental hx. (+) Teeth Intact   Pulmonary neg pulmonary ROS,    Pulmonary exam normal       Cardiovascular neg cardio ROS     Neuro/Psych Negative Neurological ROS     GI/Hepatic   Endo/Other    Renal/GU      Musculoskeletal   Abdominal Normal abdominal exam  (+)  Abdomen: soft.    Peds  Hematology   Anesthesia Other Findings   Reproductive/Obstetrics (+) Pregnancy                           Anesthesia Physical Anesthesia Plan  ASA: I and Emergent  Anesthesia Plan: General   Post-op Pain Management:    Induction: Rapid sequence and Cricoid pressure planned  Airway Management Planned: Oral ETT  Additional Equipment:   Intra-op Plan:   Post-operative Plan: Extubation in OR  Informed Consent: I have reviewed the patients History and Physical, chart, labs and discussed the procedure including the risks, benefits and alternatives for the proposed anesthesia with the patient or authorized representative who has indicated his/her understanding and acceptance.   Dental advisory given  Plan Discussed with: Anesthesiologist  Anesthesia Plan Comments:         Anesthesia Quick Evaluation

## 2011-03-10 NOTE — Anesthesia Postprocedure Evaluation (Signed)
Anesthesia Post Note  Patient: [name]  Procedure(s) Performed:  LAPAROSCOPIC UNILATERAL SALPINGECTOMY  Anesthesia type: General  Patient location: PACU  Post pain: Pain level controlled  Post assessment: Post-op Vital signs reviewed, Patient's Cardiovascular Status Stable, Respiratory Function Stable, Patent Airway, No signs of Nausea or vomiting and Pain level controlled  Last Vitals:  Filed Vitals:   03/10/11 2030  BP: 130/66  Pulse: 65  Temp:   Resp: 16    Post vital signs: Reviewed and stable  Level of consciousness: awake and alert   Complications: No apparent anesthesia complications

## 2011-03-10 NOTE — Transfer of Care (Signed)
Immediate Anesthesia Transfer of Care Note  Patient: [name]  Procedure(s) Performed:  LAPAROSCOPIC UNILATERAL SALPINGECTOMY  Patient Location: PACU  Anesthesia Type: General  Level of Consciousness: awake  Airway & Oxygen Therapy: Patient Spontanous Breathing and non-rebreather face mask  Post-op Assessment: Report given to PACU RN, Post -op Vital signs reviewed and stable and Patient moving all extremities  Post vital signs: Reviewed and stable  Complications: No apparent anesthesia complications

## 2011-03-10 NOTE — Anesthesia Postprocedure Evaluation (Signed)
Anesthesia Post Note  Patient: [name]  Procedure(s) Performed:  LAPAROSCOPIC UNILATERAL SALPINGECTOMY  Anesthesia type: General  Patient location: PACU  Post pain: Pain level controlled  Post assessment: Post-op Vital signs reviewed, Patient's Cardiovascular Status Stable, Respiratory Function Stable, Patent Airway, No signs of Nausea or vomiting and Pain level controlled  Last Vitals:  Filed Vitals:   03/10/11 2017  BP: 132/66  Pulse: 74  Temp: 36.6 C  Resp: 19    Post vital signs: Reviewed and stable  Level of consciousness: awake and alert   Complications: No apparent anesthesia complications

## 2011-03-10 NOTE — Op Note (Signed)
See detailed dictation included in brief op note.

## 2011-03-10 NOTE — Brief Op Note (Addendum)
03/10/2011  8:17 PM  PATIENT:  [name]  26 y.o. female  PRE-OPERATIVE DIAGNOSIS:  Ectopic Pregnancy Left  POST-OPERATIVE DIAGNOSIS:  Left Ectopic Pregnancy   PROCEDURE:  Procedure(s): LAPAROSCOPIC UNILATERAL SalpingOSTOMY  SURGEON:  Surgeon(s): Tilda Burrow, MD  PHYSICIAN ASSISTANT:   ASSISTANTS: kENDRICK CST   ANESTHESIA:   local and general  EBL:  Total I/O In: 850 [I.V.:850] Out: 325 [Urine:300; Blood:25]  BLOOD ADMINISTERED:none  DRAINS: Urinary Catheter (Foley)   LOCAL MEDICATIONS USED:  LIDOCAINE 10CC  SPECIMEN:  Source of Specimen:  left tubal CONTENTS  DISPOSITION OF SPECIMEN:  PATHOLOGY  COUNTS:  YES  TOURNIQUET:  * No tourniquets in log *  DICTATION: .Dragon Dictation  PLAN OF CARE: Discharge to home after PACU  PATIENT DISPOSITION:  PACU - hemodynamically stable.   Delay start of Pharmacological VTE agent (>24hrs) due to surgical blood loss or risk of bleeding:  {YES/NO/NOT APPLICABLE:20182  -Details of procedure: Patient was taken to the operating room prepped and draped for combined abdominal and vaginal procedure with Foley catheter inserted. Decision was made not to use the uterine manipulator. An infraumbilical vertical 1 cm skin incision was made transverse suprapubic sensation similar length, and right lower quadrant 5 mm incision made Veress needle was introduced through the umbilicus pneumoperitoneum achieved under 7 mm pressure after water droplet test confirmed intraperitoneal location. Pneumoperitoneum was followed by direct insertion of the 11 mm laparoscopic trocar through the umbilicus under drug visualization and placement of cephalic trochars directly visualized from the intraperitoneal camera.  Tension was directed to the pelvis where the left tube was identified is grossly distended in the ampullary portion. The IP ligament on the left was infiltrated with lidocaine with epinephrine the distended ectopic infiltrated as well. An  antimesenteric incision was made in the surface of the ectopic and then scopic manipulator placed inside tube and related to the ectopic tissue and clot. The specimen was grasped with laparoscopic graspers placed in the laparoscopic Endo Catch bag and extracted. Irrigation device was then used in generously irrigated the center of the tube and some small remnants of additional ectopic tissue were extracted by this process and collected for specimen. Pelvis was copiously irrigated. 2 small bleeding areas on the ectopic that required point cautery to achieve adequate hemostasis. Pelvis was inspected and confirmed as hemostatic laparoscopic instruments removed the fascia closed at the suprapubic, and umbilical site, with 0 Vicryl and subcuticular 3-0 Vicryl used to close the skin. Steri-Strips were applied. Estimated blood loss 25 cc condition to recovery in stable Review of old records in the office showed blood type is O+.

## 2011-03-10 NOTE — ED Notes (Signed)
Pt reports is pregnant and had been spotting.  Reports found out today she has an ectopic pregnancy.  Pt was sent here by Dr. Emelda Fear.  C/O pressure in pelvic area.  Denies abd pain.

## 2011-03-10 NOTE — Anesthesia Procedure Notes (Signed)
Procedure Name: Intubation Date/Time: 03/10/2011 7:02 PM Performed by: Minerva Areola Pre-anesthesia Checklist: Patient identified, Patient being monitored, Timeout performed, Emergency Drugs available and Suction available Patient Re-evaluated:Patient Re-evaluated prior to inductionOxygen Delivery Method: Circle System Utilized Preoxygenation: Pre-oxygenation with 100% oxygen Intubation Type: Rapid sequence and Cricoid Pressure applied Ventilation: Mask ventilation without difficulty Laryngoscope Size: Miller and 2 Grade View: Grade I Tube type: Oral Tube size: 7.0 mm Number of attempts: 1 Airway Equipment and Method: stylet Placement Confirmation: ETT inserted through vocal cords under direct vision,  positive ETCO2 and breath sounds checked- equal and bilateral Secured at: 21 cm Tube secured with: Tape Dental Injury: Teeth and Oropharynx as per pre-operative assessment

## 2011-03-21 ENCOUNTER — Inpatient Hospital Stay (HOSPITAL_COMMUNITY)
Admission: AD | Admit: 2011-03-21 | Discharge: 2011-03-21 | Disposition: A | Discharge status: Home / Self Care | Payer: Medicaid Other | Source: Ambulatory Visit | Attending: Obstetrics and Gynecology | Admitting: Obstetrics and Gynecology

## 2011-03-21 ENCOUNTER — Encounter (HOSPITAL_COMMUNITY): Payer: Self-pay | Admitting: *Deleted

## 2011-03-21 DIAGNOSIS — O00109 Unspecified tubal pregnancy without intrauterine pregnancy: Secondary | ICD-10-CM | POA: Insufficient documentation

## 2011-03-21 HISTORY — DX: Other specified health status: Z78.9

## 2011-03-21 LAB — DIFFERENTIAL
Basophils Relative: 1 % (ref 0–1)
Lymphocytes Relative: 43 % (ref 12–46)
Lymphs Abs: 2.5 10*3/uL (ref 0.7–4.0)
Monocytes Relative: 12 % (ref 3–12)
Neutro Abs: 2.5 10*3/uL (ref 1.7–7.7)
Neutrophils Relative %: 43 % (ref 43–77)

## 2011-03-21 LAB — BUN: BUN: 9 mg/dL (ref 6–23)

## 2011-03-21 LAB — CBC
Hemoglobin: 12.5 g/dL (ref 12.0–15.0)
RBC: 4.2 MIL/uL (ref 3.87–5.11)
WBC: 5.8 10*3/uL (ref 4.0–10.5)

## 2011-03-21 LAB — HCG, QUANTITATIVE, PREGNANCY: hCG, Beta Chain, Quant, S: 234 m[IU]/mL — ABNORMAL HIGH (ref <5)

## 2011-03-21 LAB — CREATININE, SERUM
Creatinine, Ser: 0.67 mg/dL (ref 0.50–1.10)
GFR calc Af Amer: >90 mL/min (ref >90)
GFR calc non Af Amer: >90 mL/min (ref >90)

## 2011-03-21 MED ORDER — METHOTREXATE INJECTION FOR WOMEN'S HOSPITAL
50.0000 mg/m2 | Freq: Once | INTRAMUSCULAR | Status: DC
  Filled 2011-03-21: qty 2

## 2011-03-21 NOTE — ED Provider Notes (Signed)
History    Residual ectopic pregnancy tissue after linear salpingostomy. [name] underwent linear salpingostomy at [facility] 12 days prior to this evaluation for an ectopic pregnancy that included a fetal pole and fetal heart motion. Since then she's had gradual decline of her quantitative hCGs but a unsatisfactory rate. Quantitative hCGs have declined to 800, then 425, and yesterday 285. Patient continues to feel nauseated as if she were pregnant, and has not had a residual menstrual slough after the ectopic. We have discussed options of continued monitoring of gradual decline , versus treating for suspected residual ectopic tissue; Patient desires to proceed toward treatment with Methotrexate.    No chief complaint on file.  HPI  Pertinent Gynecological History: Menses: Status post recent ectopic Bleeding: None at present Contraception: none pregnant/ectopic DES exposure: denies Blood transfusions: none Sexually transmitted diseases: no past history Previous GYN Procedures: Linear salpingostomy    No past medical history on file.  Past Surgical History  Procedure Date  . Tonsillectomy   . Cervical biopsy   . Breast surgery     No family history on file.  History  Substance Use Topics  . Smoking status: Never Smoker   . Smokeless tobacco: Not on file  . Alcohol Use: No    Allergies:  Allergies  Allergen Reactions  . Xanax     UNKNOWN REACTION: Placed in Hospital  . Latex     Contraceptive    Prescriptions prior to admission  Medication Sig Dispense Refill  . folic acid (FOLVITE) 1 MG tablet Take 1 mg by mouth daily.      . Multiple Vitamin (MULITIVITAMIN WITH MINERALS) TABS Take 1 tablet by mouth daily.        ROS Physical Exam   There were no vitals taken for this visit.  Physical ExamPhysical Examination: General appearance - alert, well appearing, and in no distress Abdomen - soft, nontender, nondistended, no masses or organomegaly scars from  previous incisions well healing Pelvic - examination not indicated Extremities - peripheral pulses normal, no pedal edema, no clubbing or cyanosis, Homan's sign negative bilaterally Labs: Q-hcg 234.  Approximately 20% drop in levels in 24 hours.  After further discussion, pt desires to be followed serially.  Pt emphatically notes NO lq pain. Pt reports she did have heavy menses with tissue passage 2 days after salpingostomy. MAU Course  Procedures  MDM   Assessment and Plan  S/p Ectopic treated with Methotrexate, slow resolution Plan: keep appt 03/24/11 for weekly qhcg at [facility] V 03/21/2011, 6:20 PM

## 2011-03-21 NOTE — Progress Notes (Signed)
Pt states she still has nausea & breast tenderness.  "I still feel like I'm pregnant."

## 2011-03-21 NOTE — H&P (Signed)
[name] is an 26 y.o. female. She is admitted for a suture left linear salpingostomy to remove the left ectopic pregnancy. The ectopic was diagnosis afternoon in the office where ultrasound shows in ampullary ectopic pregnancy with 5 mm fetal pole with heartbeat. Treatment options have been discussed including methotrexate versus surgical regimens. We have agreed that failure rate for methotrexate is considered high and therefore recommended surgical treatment. Plans are to try to salvage the left tube. The patient is aware that linear salpingostomy is not always effective in on occasion the left tube Must  be removed.  Pertinent Gynecological History: Blood transfusions: none Sexually transmitted diseases: no past history Previous GYN Procedures:    Last pap: normal Date: 10/2009 OB History: G4, P3   Menstrual History: Menarche age : LMP 01/30/2011 No LMP recorded. Patient is pregnant.    History reviewed. No pertinent past medical history.  Past Surgical History  Procedure Date  . Tonsillectomy   . Cervical biopsy   . Breast surgery     No family history on file.  Social History:  reports that she has never smoked. She does not have any smokeless tobacco history on file. She reports that she does not drink alcohol or use illicit drugs.  Allergies:  Allergies  Allergen Reactions  . Xanax     UNKNOWN REACTION: Placed in Hospital  . Latex     Contraceptive    No prescriptions prior to admission    ROS  patient has noted dull left lower quadrant pain like something was causing pressure symptoms, normal bowel function  Blood pressure 127/64, pulse 84, temperature 98 F (36.7 C), temperature source Oral, resp. rate 14, height 5\' 5"  (1.651 m), weight 85.73 kg (189 lb), SpO2 100.00%.  Physical Examination: General appearance - alert, well appearing, and in no distress, oriented to person, place, and time, normal appearing weight, anxious and in mild to moderate  distress Mouth - mucous membranes moist, pharynx normal without lesions and dental hygiene good Chest - clear to auscultation, no wheezes, rales or rhonchi, symmetric air entry Heart - normal rate and regular rhythm Abdomen - tenderness noted in the left lower quadrant, mild intensity, no rebound Pelvic - VULVA: normal appearing vulva with no masses, tenderness or lesions, VAGINA: normal appearing vagina with normal color and discharge, no lesions, CERVIX: Evidence of prior LEEP conization of the cervix, UTERUS: anteverted, mobile, as judged by ultrasound, no bimanual exam ADNEXA: normal adnexa in size, nontender and no masses, tenderness left, with no pelvic fluid. Ultrasound shows a live ectopic pregnancy in the distal left tube Legs normal Physical Exam   No results found for this or any previous visit (from the past 24 hour(s)).  No results found. CBC ordered pregnancy test positive last week in the office at 1500  Assessment/Plan:Unruptured left ectopic pregnancy ampullary, with living fetal pole scheduled for laparoscopic linear salpingostomy possible left salpingectomy this evening at 6:30 PM. Patient is n.p.o. having last eaten food at 12:30 PM  technical aspects of procedure reviewed briefly with patient consent obtained verbally to be documented upon return to ED tonight at [facility] V 03/21/2011, 8:57 AM  [name] was sent to the Emergency Room where she was prepared for surgery, and taken to the OR.  I interviewed her in the ER again to confirm stable pt status, before proceeding to surgery.

## 2011-03-21 NOTE — Progress Notes (Signed)
Pt had laparascopic surgery for ectopic on 1/21 by Dr. Emelda Fear.  Pt has been following up @ his office, BHCG remains elevated, pt instructed to come to MAU for MTX.

## 2011-03-26 ENCOUNTER — Emergency Department (HOSPITAL_COMMUNITY)
Admission: EM | Admit: 2011-03-26 | Discharge: 2011-03-27 | Disposition: A | Discharge status: Home / Self Care | Payer: Medicaid Other | Attending: Emergency Medicine | Admitting: Emergency Medicine

## 2011-03-26 ENCOUNTER — Encounter (HOSPITAL_COMMUNITY): Payer: Self-pay | Admitting: *Deleted

## 2011-03-26 DIAGNOSIS — N898 Other specified noninflammatory disorders of vagina: Secondary | ICD-10-CM | POA: Insufficient documentation

## 2011-03-26 DIAGNOSIS — N939 Abnormal uterine and vaginal bleeding, unspecified: Secondary | ICD-10-CM

## 2011-03-26 DIAGNOSIS — R109 Unspecified abdominal pain: Secondary | ICD-10-CM | POA: Insufficient documentation

## 2011-03-26 DIAGNOSIS — R110 Nausea: Secondary | ICD-10-CM | POA: Insufficient documentation

## 2011-03-26 DIAGNOSIS — Z9889 Other specified postprocedural states: Secondary | ICD-10-CM | POA: Insufficient documentation

## 2011-03-26 LAB — URINALYSIS, ROUTINE W REFLEX MICROSCOPIC
Glucose, UA: NEGATIVE mg/dL
Ketones, ur: NEGATIVE mg/dL
Leukocytes, UA: NEGATIVE
Protein, ur: NEGATIVE mg/dL

## 2011-03-26 LAB — URINE MICROSCOPIC-ADD ON

## 2011-03-26 MED ORDER — ONDANSETRON 4 MG PO TBDP
4.0000 mg | ORAL_TABLET | Freq: Once | ORAL | Status: AC
  Administered 2011-03-26: 4 mg via ORAL
  Filled 2011-03-26: qty 1

## 2011-03-26 NOTE — ED Provider Notes (Signed)
This chart was scribed for Sunnie Nielsen, MD by Wallis Mart. The patient was seen in room APA01/APA01 and the patient's care was started at 11:15 PM.   CSN: [id number]  Arrival date & time 03/26/11  2018   First MD Initiated Contact with Patient 03/26/11 2303      Chief Complaint  Patient presents with  . Abdominal Pain    (Consider location/radiation/quality/duration/timing/severity/associated sxs/prior treatment) HPI [name] is a 26 y.o. female who presents to the Emergency Department complaining of sudden onset, persistence of constant, moderate to severe superpubic abd pain and nausea onset 2 days ago.  Nausea worsens when breathing.  Pt reports spotting when she wipes.  Pt Denies urinary frequency, dysuria, diarrhea, vomiting.  Pt has been seeing Dr. Emelda Fear for an etopic pregnancy on Jan 21, he has been following her HGC levels since that time. Status post laparoscopic removal of left ectopic pregnancy. Pt states that she bled from Jan 25 for 4 days, her blood was tested and hormone levels weren't coming down.  She had her blood drawn Monday and her hormone levels have gone back up (250.8).  G:7 P: 2 Moderate in severity. No other associated symptoms. Past Medical History  Diagnosis Date  . No pertinent past medical history     Past Surgical History  Procedure Date  . Tonsillectomy   . Cervical biopsy   . Breast surgery   . Laparoscopy for ectopic pregnancy     No family history on file.  History  Substance Use Topics  . Smoking status: Never Smoker   . Smokeless tobacco: Not on file  . Alcohol Use: Yes     occasionally    OB History    Grav Para Term Preterm Abortions TAB SAB Ect Mult Living   7 2  2 4  3 1  2       Review of Systems  10 Systems reviewed and are negative for acute change except as noted in the HPI.  Allergies  Xanax and Latex  Home Medications   Current Outpatient Rx  Name Route Sig Dispense Refill  . FOLIC ACID 1 MG PO TABS  Oral Take 1 mg by mouth daily.    . ADULT MULTIVITAMIN W/MINERALS CH Oral Take 1 tablet by mouth daily.      BP 125/73  Pulse 64  Temp 98.2 F (36.8 C)  Resp 20  Ht 5\' 5"  (1.651 m)  Wt 189 lb (85.73 kg)  BMI 31.45 kg/m2  SpO2 100%  LMP 03/14/2011  Breastfeeding? Unknown  Physical Exam  Nursing note and vitals reviewed. Constitutional: She is oriented to person, place, and time. She appears well-developed and well-nourished. No distress.  HENT:  Head: Normocephalic and atraumatic.  Eyes: EOM are normal. Pupils are equal, round, and reactive to light.  Neck: Normal range of motion. Neck supple. No tracheal deviation present.  Cardiovascular: Normal rate and regular rhythm.   Pulmonary/Chest: Effort normal and breath sounds normal. No respiratory distress.  Abdominal: Soft. Bowel sounds are normal. She exhibits no distension. There is no tenderness. There is no CVA tenderness.       No reproducible tenderness. Localizes discomfort to superpubic region  Musculoskeletal: Normal range of motion. She exhibits no edema.  Neurological: She is alert and oriented to person, place, and time. No sensory deficit.  Skin: Skin is warm and dry.  Psychiatric: She has a normal mood and affect. Her behavior is normal.    ED Course  Procedures (including critical  care time) DIAGNOSTIC STUDIES: Oxygen Saturation is 100% on room air, normal by my interpretation.    COORDINATION OF CARE:   Results for orders placed during the hospital encounter of 03/26/11  URINALYSIS, ROUTINE W REFLEX MICROSCOPIC      Component Value Range   Color, Urine AMBER (*) YELLOW    APPearance CLEAR  CLEAR    Specific Gravity, Urine >1.030 (*) 1.005 - 1.030    pH 6.0  5.0 - 8.0    Glucose, UA NEGATIVE  NEGATIVE (mg/dL)   Hgb urine dipstick MODERATE (*) NEGATIVE    Bilirubin Urine NEGATIVE  NEGATIVE    Ketones, ur NEGATIVE  NEGATIVE (mg/dL)   Protein, ur NEGATIVE  NEGATIVE (mg/dL)   Urobilinogen, UA 0.2  0.0 -  1.0 (mg/dL)   Nitrite NEGATIVE  NEGATIVE    Leukocytes, UA NEGATIVE  NEGATIVE   PREGNANCY, URINE      Component Value Range   Preg Test, Ur POSITIVE (*) NEGATIVE   URINE MICROSCOPIC-ADD ON      Component Value Range   Squamous Epithelial / LPF FEW (*) RARE    WBC, UA 11-20  <3 (WBC/hpf)   RBC / HPF 0-2  <3 (RBC/hpf)   Bacteria, UA FEW (*) RARE   HCG, QUANTITATIVE, PREGNANCY      Component Value Range   hCG, Beta Chain, Quant, S 180 (*) <5 (mIU/mL)    12:59 AM d/w Dr Emelda Fear who feels HCG level is slowly falling and PT is appropriate for d/c home without [place] and can be seen in the Morning in the clinic for follow up as needed  Patient comfortable with plan as above.  MDM  Persistent vaginal spotting and nausea status post surgery for ectopic pregnancy.patient very anxious as she was unable to talk with Dr. Emelda Fear today presenting tonight for evaluation. Labs reviewed as above and case discussed with Dr. Emelda Fear. Plan outpatient followup. No indication for emergent ultrasound or further evaluation at this time. No symptoms of anemia or history of significant vaginal bleeding.   I personally performed the services described in this documentation, which was scribed in my presence. The recorded information has been reviewed and considered.          Sunnie Nielsen, MD 03/27/11 [phone]

## 2011-03-26 NOTE — ED Notes (Signed)
Pt reports Dr. Emelda Fear laproscopic removal of  Left etopic pregnancy Jan 21, pt reports since she has been spotting and her blood HCg levels have been increasing, pt reports she has been unable to see Dr. Emelda Fear

## 2011-03-27 MED ORDER — CEPHALEXIN 500 MG PO CAPS: 500.0000 mg | ORAL_CAPSULE | Freq: Four times a day (QID) | ORAL | Status: AC

## 2011-03-27 NOTE — ED Notes (Signed)
Pt alert & oriented x4, stable gait. Pt given discharge instructions, paperwork & prescription(s), pt verbalized understanding. Pt left department w/ no further questions.  

## 2011-03-27 NOTE — ED Notes (Signed)
Pt advised EDP was speaking w/ ob md & after that we should know what was going to be next. Pt voiced no needs at this time.

## 2011-03-28 ENCOUNTER — Other Ambulatory Visit: Payer: Self-pay | Admitting: Obstetrics and Gynecology

## 2011-03-28 ENCOUNTER — Inpatient Hospital Stay (HOSPITAL_COMMUNITY)
Admission: AD | Admit: 2011-03-28 | Discharge: 2011-03-28 | Disposition: A | Discharge status: Home / Self Care | Payer: Medicaid Other | Source: Ambulatory Visit | Attending: Obstetrics and Gynecology | Admitting: Obstetrics and Gynecology

## 2011-03-28 ENCOUNTER — Encounter: Payer: Self-pay | Admitting: Obstetrics and Gynecology

## 2011-03-28 DIAGNOSIS — O00109 Unspecified tubal pregnancy without intrauterine pregnancy: Secondary | ICD-10-CM | POA: Insufficient documentation

## 2011-03-28 LAB — COMPREHENSIVE METABOLIC PANEL
ALT: 21 U/L (ref 0–35)
Albumin: 3.9 g/dL (ref 3.5–5.2)
Alkaline Phosphatase: 114 U/L (ref 39–117)
GFR calc Af Amer: >90 mL/min (ref >90)
Glucose, Bld: 116 mg/dL — ABNORMAL HIGH (ref 70–99)
Potassium: 3.6 mEq/L (ref 3.5–5.1)
Sodium: 136 mEq/L (ref 135–145)
Total Protein: 7.2 g/dL (ref 6.0–8.3)

## 2011-03-28 LAB — CBC
HCT: 37 % (ref 36.0–46.0)
MCH: 29.9 pg (ref 26.0–34.0)
MCHC: 32.4 g/dL (ref 30.0–36.0)
MCV: 92 fL (ref 78.0–100.0)
RDW: 12.9 % (ref 11.5–15.5)

## 2011-03-28 MED ORDER — METHOTREXATE INJECTION FOR WOMEN'S HOSPITAL
50.0000 mg/m2 | Freq: Once | INTRAMUSCULAR | Status: AC
  Administered 2011-03-28: 100 mg via INTRAMUSCULAR
  Filled 2011-03-28: qty 2

## 2011-03-28 NOTE — Progress Notes (Signed)
Pt states, " I was sent from the office for MTX. I've already had surgery for an ectopic pregnancy and my levels are not going down."

## 2011-03-28 NOTE — Progress Notes (Signed)
  Subjective:    Patient ID: [name], female    DOB: September 27, 1985, 26 y.o.   MRN: [id number]  HPI  Followup after Ectopic: Residual ectopic tissues suspected.  This 26 yr female is being followed for resolution of pregnancy, after Left Linear salpingostomy on 03/10/2011, with slowly declining qHcg levels. Patient is now interested in receiving MTX therapy to expedite resolution of remaining ectopic tissues.  Treatment to date:   Date                 [facility]  02/27/11           146.8 03/04/11           1702.4 03/10/11                      Surgery: Left Linear Salpingostomy after u/s showed +fhm in Left ectopic 03/17/11            432.5 03/20/11             285.9 03/21/11              234         Evaluated [facility]. recommended for treatment with MTX but pt decided to just be followed 03/24/11              250 03/26/11               180        ER visit [facility] 03/28/11                         Patient now interested in proceeding with MTX for suspected residual ectopic tissues.   Review of Systems Patient describes still feeling like she's pregnant, and having vague lower abd aches. Desires to expedite resolution of ectopic    Objective:   Physical Exam        Assessment & Plan:  Suspected residual ectopic cells s/p Left Linear Salpingostomy  Referred to [facility] for eval for MTX tx.  Discussed with Henrietta Hoover, on call .

## 2011-03-28 NOTE — ED Notes (Signed)
Pt informed that labs are back and MTX is being brought down by pharmacy and should be here very soon.

## 2011-03-31 ENCOUNTER — Inpatient Hospital Stay (HOSPITAL_COMMUNITY)
Admission: AD | Admit: 2011-03-31 | Discharge: 2011-03-31 | Disposition: A | Discharge status: Home / Self Care | Payer: Medicaid Other | Source: Ambulatory Visit | Attending: Obstetrics & Gynecology | Admitting: Obstetrics & Gynecology

## 2011-03-31 ENCOUNTER — Inpatient Hospital Stay (HOSPITAL_COMMUNITY): Payer: Medicaid Other

## 2011-03-31 ENCOUNTER — Ambulatory Visit (HOSPITAL_COMMUNITY): Admission: RE | Admit: 2011-03-31 | Payer: Medicaid Other | Source: Ambulatory Visit

## 2011-03-31 DIAGNOSIS — O00109 Unspecified tubal pregnancy without intrauterine pregnancy: Secondary | ICD-10-CM | POA: Insufficient documentation

## 2011-03-31 DIAGNOSIS — Z09 Encounter for follow-up examination after completed treatment for conditions other than malignant neoplasm: Secondary | ICD-10-CM

## 2011-03-31 LAB — CBC
HCT: 38.3 % (ref 36.0–46.0)
Hemoglobin: 12.3 g/dL (ref 12.0–15.0)
MCV: 92.1 fL (ref 78.0–100.0)
RBC: 4.16 MIL/uL (ref 3.87–5.11)
WBC: 4.1 10*3/uL (ref 4.0–10.5)

## 2011-03-31 LAB — HCG, QUANTITATIVE, PREGNANCY: hCG, Beta Chain, Quant, S: 172 m[IU]/mL — ABNORMAL HIGH (ref <5)

## 2011-03-31 NOTE — Progress Notes (Signed)
Pt cell phone called, pt states that she called Dr. Robinette Haines office at [facility] and was told to come over to the office.  Informed that she may come back here as needed.

## 2011-03-31 NOTE — ED Provider Notes (Signed)
[name] is a 26 y.o. female who presents to MAU for follow up Bhcg s/p MTX on 03/28/11. She had a documented ectopic pregnancy that required surgery and continues to have elevated Bhcg. It was discussed with the patient that this could be due to  residual tissue from the ectopic and was recommended that she receive MTX. On 2/8 her Bhcg was 178 and today is 172.  Results for orders placed during the hospital encounter of 03/31/11 (from the past 24 hour(s))  HCG, QUANTITATIVE, PREGNANCY     Status: Abnormal   Collection Time   03/31/11 11:00 AM      Component Value Range   hCG, Beta Chain, Quant, S 172 (*) <5 (mIU/mL)   Return day 7 for repeat bhcg. Continue ectopic precautions.  [place], NP 03/31/11 1229  Patient reports having dizziness, headache and abdominal pain for the past 2 days. I discussed with Dr. Debroah Loop and we will check Hgb. And repeat pelvic ultrasound.  Called for patient in lobby to send for ultrasound and she had left. Called patient on cell phone and she states she is feeling fine now and has called Dr. Emelda Fear for follow up in the office. Patient instructed to return for problems.  [place], [place] 03/31/11 1816

## 2011-03-31 NOTE — Progress Notes (Signed)
Pt here for repeat BHCG after MTX.  Pt states she is still having abdominal pain, headache, and dizziness.  Pt informed of need to evaluate complaints further.  Pt voices understanding.

## 2011-03-31 NOTE — Progress Notes (Signed)
Pt called from lobby for completion of triage, transportation to ultrasound, and further evaluation, pt not in lobby.  Will call cell phone of patient.

## 2011-04-03 ENCOUNTER — Ambulatory Visit (HOSPITAL_COMMUNITY): Payer: Medicaid Other

## 2011-06-04 ENCOUNTER — Other Ambulatory Visit (HOSPITAL_COMMUNITY)
Admission: RE | Admit: 2011-06-04 | Discharge: 2011-06-04 | Disposition: A | Discharge status: Home / Self Care | Payer: Medicaid Other | Source: Ambulatory Visit | Attending: Obstetrics and Gynecology | Admitting: Obstetrics and Gynecology

## 2011-06-04 ENCOUNTER — Other Ambulatory Visit: Payer: Self-pay | Admitting: Obstetrics and Gynecology

## 2011-06-04 DIAGNOSIS — Z01419 Encounter for gynecological examination (general) (routine) without abnormal findings: Secondary | ICD-10-CM | POA: Insufficient documentation

## 2011-06-18 ENCOUNTER — Other Ambulatory Visit: Payer: Self-pay | Admitting: Obstetrics and Gynecology

## 2011-06-23 ENCOUNTER — Ambulatory Visit (HOSPITAL_COMMUNITY)
Admission: RE | Admit: 2011-06-23 | Discharge: 2011-06-23 | Disposition: A | Discharge status: Home / Self Care | Payer: Medicaid Other | Source: Ambulatory Visit | Attending: Obstetrics and Gynecology | Admitting: Obstetrics and Gynecology

## 2011-06-23 DIAGNOSIS — O009 Unspecified ectopic pregnancy without intrauterine pregnancy: Secondary | ICD-10-CM | POA: Insufficient documentation

## 2011-10-22 LAB — OB RESULTS CONSOLE HEPATITIS B SURFACE ANTIGEN: Hepatitis B Surface Ag: NEGATIVE

## 2011-10-22 LAB — OB RESULTS CONSOLE HIV ANTIBODY (ROUTINE TESTING): HIV: NONREACTIVE

## 2011-10-22 LAB — OB RESULTS CONSOLE RUBELLA ANTIBODY, IGM: Rubella: IMMUNE

## 2011-10-22 LAB — OB RESULTS CONSOLE VARICELLA ZOSTER ANTIBODY, IGG: Varicella: IMMUNE

## 2011-10-22 LAB — OB RESULTS CONSOLE ABO/RH: RH Type: POSITIVE

## 2011-11-13 ENCOUNTER — Encounter (HOSPITAL_COMMUNITY): Payer: Self-pay | Admitting: *Deleted

## 2011-11-13 ENCOUNTER — Inpatient Hospital Stay (HOSPITAL_COMMUNITY)
Admission: AD | Admit: 2011-11-13 | Discharge: 2011-11-13 | Disposition: A | Discharge status: Home / Self Care | Payer: Medicaid Other | Source: Ambulatory Visit | Attending: Obstetrics & Gynecology | Admitting: Obstetrics & Gynecology

## 2011-11-13 DIAGNOSIS — O209 Hemorrhage in early pregnancy, unspecified: Secondary | ICD-10-CM

## 2011-11-13 NOTE — MAU Note (Signed)
Pt report she is about 8-9 weeks preg and started bleeding about 3 hours ago and about one hour ago passed a clot and "some tissue", has had one episode of bleeding but has had an ultrasound since then. Denies pain

## 2011-11-13 NOTE — MAU Provider Note (Signed)
History     CSN: [id number]  Arrival date and time: 11/13/11 2034   First Provider Initiated Contact with Patient 11/13/11 2249      Chief Complaint  Patient presents with  . Vaginal Bleeding   HPI This is a 26 y.o. female at [redacted]w[redacted]d who presents with c/o vaginal bleeding. Denies cramping. Had an ultrasound last week and "everything was fine".  Has an appointment for New OB and pelvic exam on Monday.   RN Note: Pt report she is about 8-9 weeks preg and started bleeding about 3 hours ago and about one hour ago passed a clot and "some tissue", has had one episode of bleeding but has had an ultrasound since then. Denies pain      OB History    Grav Para Term Preterm Abortions TAB SAB Ect Mult Living   8 2  2 4  3 1  2       Past Medical History  Diagnosis Date  . No pertinent past medical history     Past Surgical History  Procedure Date  . Tonsillectomy   . Cervical biopsy   . Breast surgery   . Laparoscopy for ectopic pregnancy 03/10/2011    Left Linear Salpingostomy,     History reviewed. No pertinent family history.  History  Substance Use Topics  . Smoking status: Never Smoker   . Smokeless tobacco: Not on file  . Alcohol Use: Yes     occasionally    Allergies:  Allergies  Allergen Reactions  . Alprazolam     UNKNOWN REACTION: Placed in Hospital  . Latex     Contraceptive    Prescriptions prior to admission  Medication Sig Dispense Refill  . acetaminophen (TYLENOL) 325 MG tablet Take 650 mg by mouth every 6 (six) hours as needed.      . calcium carbonate (TUMS - DOSED IN MG ELEMENTAL CALCIUM) 500 MG chewable tablet Chew 1 tablet by mouth daily.      . folic acid (FOLVITE) 800 MCG tablet Take 400 mcg by mouth daily.      . Prenatal Vit-Fe Fumarate-FA (PRENATAL S PO) Take by mouth.      . pseudoephedrine (SUDAFED) 60 MG tablet Take 60 mg by mouth every 4 (four) hours as needed.        ROS See HPI  Physical Exam   Blood pressure 125/66, pulse 81,  temperature 98 F (36.7 C), temperature source Oral, resp. rate 18, height 5\' 6"  (1.676 m), weight 197 lb (89.359 kg), last menstrual period 03/14/2011, SpO2 100.00%.  Physical Exam  Constitutional: She is oriented to person, place, and time. She appears well-developed and well-nourished. No distress.  Cardiovascular: Normal rate.   Respiratory: Effort normal.  GI: Soft. She exhibits no distension and no mass. There is no tenderness. There is no rebound and no guarding.  Genitourinary: Uterus normal. Vaginal discharge (small to moderate blood in vault, cervix closed and long) found.       Bedside US done showing active fetus with HR 160 and ?subchorionic hemorrhage  Musculoskeletal: Normal range of motion.  Neurological: She is alert and oriented to person, place, and time.  Skin: Skin is warm and dry.  Psychiatric: She has a normal mood and affect.    MAU Course  Procedures  Assessment and Plan  A:  SIUP at [redacted]w[redacted]d       First Trimester Bleeding      Live fetus P:  Discharge home      Pelvic  rest      Bleeding precautions      Followup in office Monday   [facility] 11/13/2011, 11:02 PM

## 2011-11-16 NOTE — MAU Provider Note (Signed)
Attestation of Attending Supervision of Advanced Practitioner (CNM/NP): Evaluation and management procedures were performed by the Advanced Practitioner under my supervision and collaboration.  I have reviewed the Advanced Practitioner's note and chart, and I agree with the management and plan.  HARRAWAY-SMITH, Kiauna Zywicki 7:39 PM     

## 2012-04-13 ENCOUNTER — Encounter: Payer: Self-pay | Admitting: *Deleted

## 2012-04-30 ENCOUNTER — Inpatient Hospital Stay (HOSPITAL_COMMUNITY): Payer: Medicaid Other

## 2012-04-30 ENCOUNTER — Encounter (HOSPITAL_COMMUNITY): Payer: Self-pay

## 2012-04-30 ENCOUNTER — Inpatient Hospital Stay (HOSPITAL_COMMUNITY)
Admission: AD | Admit: 2012-04-30 | Discharge: 2012-04-30 | Disposition: A | Discharge status: Home / Self Care | Payer: Medicaid Other | Source: Ambulatory Visit | Attending: Obstetrics and Gynecology | Admitting: Obstetrics and Gynecology

## 2012-04-30 DIAGNOSIS — O4700 False labor before 37 completed weeks of gestation, unspecified trimester: Secondary | ICD-10-CM | POA: Insufficient documentation

## 2012-04-30 DIAGNOSIS — IMO0002 Reserved for concepts with insufficient information to code with codable children: Secondary | ICD-10-CM | POA: Insufficient documentation

## 2012-04-30 DIAGNOSIS — O479 False labor, unspecified: Secondary | ICD-10-CM

## 2012-04-30 LAB — PROTEIN / CREATININE RATIO, URINE
Creatinine, Urine: 106.64 mg/dL
Total Protein, Urine: 10 mg/dL

## 2012-04-30 LAB — CBC
HCT: 35.8 % — ABNORMAL LOW (ref 36.0–46.0)
MCH: 28.7 pg (ref 26.0–34.0)
MCV: 86.5 fL (ref 78.0–100.0)
RDW: 13 % (ref 11.5–15.5)
WBC: 10.7 10*3/uL — ABNORMAL HIGH (ref 4.0–10.5)

## 2012-04-30 LAB — URINALYSIS, ROUTINE W REFLEX MICROSCOPIC
Bilirubin Urine: NEGATIVE
Glucose, UA: NEGATIVE mg/dL
Hgb urine dipstick: NEGATIVE
Ketones, ur: NEGATIVE mg/dL
Protein, ur: NEGATIVE mg/dL

## 2012-04-30 LAB — COMPREHENSIVE METABOLIC PANEL
Albumin: 2.4 g/dL — ABNORMAL LOW (ref 3.5–5.2)
BUN: 6 mg/dL (ref 6–23)
CO2: 22 mEq/L (ref 19–32)
Chloride: 104 mEq/L (ref 96–112)
Creatinine, Ser: 0.51 mg/dL (ref 0.50–1.10)
GFR calc non Af Amer: >90 mL/min (ref >90)
Total Bilirubin: 0.1 mg/dL — ABNORMAL LOW (ref 0.3–1.2)

## 2012-04-30 MED ORDER — NIFEDIPINE 10 MG PO CAPS
10.0000 mg | ORAL_CAPSULE | ORAL | Status: DC | PRN
  Administered 2012-04-30 (×2): 10 mg via ORAL
  Filled 2012-04-30 (×2): qty 1

## 2012-04-30 MED ORDER — LACTATED RINGERS IV BOLUS (SEPSIS)
1000.0000 mL | Freq: Once | INTRAVENOUS | Status: AC
  Administered 2012-04-30: 1000 mL via INTRAVENOUS

## 2012-04-30 MED ORDER — LACTATED RINGERS IV BOLUS (SEPSIS): 1000.0000 mL | Freq: Once | INTRAVENOUS | Status: DC

## 2012-04-30 NOTE — MAU Provider Note (Signed)
History   27 y/o [id number] at [redacted]w[redacted]d with hx of multipal SAB and 2 premature deliveries at 30 and 35 days presenting with contractions and hx of trace urine protein and episodic elevated blood pressures.    CSN: [id number]  Arrival date and time: 04/30/12 6578   None     Chief Complaint  Patient presents with  . Labor Eval   HPI  Contractions: Pt mentions contractions that started this am around 0200 that woke her up from sleep.  Pt mentions increasing regularity at every 5 minutes prior to arrival.  Pt mentions continued fetal movement, denies large loss of fluid, blood or increased vaginal discharge.  Pt denies pain with urination, increased frequency,   Headache: Pt mentions 8/10 frontal headache that woke the pt up this am along with the contractions. Associated with blurry vision, RUQ tenderness that is intermittent and a 7 lb weight gain since Monday the pt mentions is related to swelling of feet, hands and feet.  Pt denies seizure activity.    Pertinent Gynecological History: Bleeding: denies   Past Medical History  Diagnosis Date  . No pertinent past medical history   . Incompetent cervix in pregnancy   . Abnormal Pap smear     Past Surgical History  Procedure Laterality Date  . Tonsillectomy    . Cervical biopsy    . Breast surgery    . Laparoscopy for ectopic pregnancy  03/10/2011    Left Linear Salpingostomy,   . Dilation and curettage of uterus    . Cervical cerclage N/A 2006  . Ectopic pregnancy surgery Left 2013    Family History  Problem Relation Age of Onset  . Endometriosis Other   . Diabetes Other   . Hypertension Other     History  Substance Use Topics  . Smoking status: Never Smoker   . Smokeless tobacco: Not on file  . Alcohol Use: No     Comment: occasionally    Allergies:  Allergies  Allergen Reactions  . Alprazolam     UNKNOWN REACTION: Placed in Hospital  . Latex     Contraceptive    Prescriptions prior to admission  Medication  Sig Dispense Refill  . acetaminophen (TYLENOL) 325 MG tablet Take 650 mg by mouth every 6 (six) hours as needed.      . calcium carbonate (TUMS - DOSED IN MG ELEMENTAL CALCIUM) 500 MG chewable tablet Chew 1 tablet by mouth daily.      . folic acid (FOLVITE) 800 MCG tablet Take 400 mcg by mouth daily.      . hydroxyprogesterone caproate (DELALUTIN) 250 mg/mL OIL Inject 250 mg into the muscle once a week.      . Prenatal Vit-Fe Fumarate-FA (PRENATAL S PO) Take by mouth.        Review of Systems  Constitutional: Negative for fever, chills and weight loss.  HENT: Negative for hearing loss, nosebleeds and sore throat.   Eyes: Positive for blurred vision and double vision. Negative for photophobia and pain.  Respiratory: Negative for cough and hemoptysis.   Cardiovascular: Positive for leg swelling. Negative for chest pain, palpitations and orthopnea.  Gastrointestinal: Positive for abdominal pain. Negative for heartburn, nausea, vomiting, diarrhea and constipation.  Genitourinary: Negative for dysuria, urgency and frequency.  Musculoskeletal: Negative for myalgias and joint pain.  Skin: Negative for itching and rash.  Neurological: Positive for headaches. Negative for dizziness, tingling and seizures.  Endo/Heme/Allergies: Bruises/bleeds easily.  All other systems reviewed and are negative.  Physical Exam   Blood pressure 120/63, pulse 79, temperature 97.8 F (36.6 C), temperature source Oral, resp. rate 18, height 5\' 7"  (1.702 m), weight 109.498 kg (241 lb 6.4 oz), last menstrual period 09/12/2011, SpO2 97.00%.  Physical Exam  Vitals reviewed. Constitutional: She is oriented to person, place, and time. She appears well-developed and well-nourished.  HENT:  Head: Normocephalic and atraumatic.  Eyes: EOM are normal. Pupils are equal, round, and reactive to light.  Neck: Normal range of motion. Neck supple.  Cardiovascular: Normal rate, regular rhythm and intact distal pulses.  Exam  reveals no gallop and no friction rub.   No murmur heard. Respiratory: Effort normal and breath sounds normal. No respiratory distress. She has no wheezes. She has no rales.  GI: Soft. Bowel sounds are normal. She exhibits no distension.  Genitourinary: Vagina normal and uterus normal.  Cervical exam: finger tip, long, soft  Vaginal exam: minimal white mucus accumulation  Musculoskeletal: Normal range of motion. She exhibits edema. She exhibits no tenderness.       Right shoulder: She exhibits swelling.  Swelling under ring on L ring finger  Neurological: She is alert and oriented to person, place, and time. She has normal reflexes.  Reflex Scores:      Brachioradialis reflexes are 2+ on the right side and 2+ on the left side.      Achilles reflexes are 2+ on the right side and 2+ on the left side. Skin: Skin is warm and dry.  Psychiatric: She has a normal mood and affect. Her behavior is normal. Thought content normal.   FHR: 150, positive accelerations, negative decerlations, moderate variablity, contractions ever 4-5 mintues   Results for orders placed during the hospital encounter of 04/30/12 (from the past 24 hour(s))  URINALYSIS, ROUTINE W REFLEX MICROSCOPIC     Status: None   Collection Time    04/30/12  4:32 AM      Result Value Range   Color, Urine YELLOW  YELLOW   APPearance CLEAR  CLEAR   Specific Gravity, Urine 1.020  1.005 - 1.030   pH 7.0  5.0 - 8.0   Glucose, UA NEGATIVE  NEGATIVE mg/dL   Hgb urine dipstick NEGATIVE  NEGATIVE   Bilirubin Urine NEGATIVE  NEGATIVE   Ketones, ur NEGATIVE  NEGATIVE mg/dL   Protein, ur NEGATIVE  NEGATIVE mg/dL   Urobilinogen, UA 0.2  0.0 - 1.0 mg/dL   Nitrite NEGATIVE  NEGATIVE   Leukocytes, UA NEGATIVE  NEGATIVE  PROTEIN / CREATININE RATIO, URINE     Status: None   Collection Time    04/30/12  4:32 AM      Result Value Range   Creatinine, Urine 106.64     Total Protein, Urine 10     PROTEIN CREATININE RATIO 0.09  0.00 - 0.15   CBC     Status: Abnormal   Collection Time    04/30/12  6:09 AM      Result Value Range   WBC 10.7 (*) 4.0 - 10.5 K/uL   RBC 4.14  3.87 - 5.11 MIL/uL   Hemoglobin 11.9 (*) 12.0 - 15.0 g/dL   HCT 16.1 (*) 09.6 - 04.5 %   MCV 86.5  78.0 - 100.0 fL   MCH 28.7  26.0 - 34.0 pg   MCHC 33.2  30.0 - 36.0 g/dL   RDW 40.9  81.1 - 91.4 %   Platelets 322  150 - 400 K/uL  COMPREHENSIVE METABOLIC PANEL  Status: Abnormal   Collection Time    04/30/12  6:09 AM      Result Value Range   Sodium 136  135 - 145 mEq/L   Potassium 4.0  3.5 - 5.1 mEq/L   Chloride 104  96 - 112 mEq/L   CO2 22  19 - 32 mEq/L   Glucose, Bld 98  70 - 99 mg/dL   BUN 6  6 - 23 mg/dL   Creatinine, Ser 4.54  0.50 - 1.10 mg/dL   Calcium 8.9  8.4 - 09.8 mg/dL   Total Protein 6.3  6.0 - 8.3 g/dL   Albumin 2.4 (*) 3.5 - 5.2 g/dL   AST 13  0 - 37 U/L   ALT 16  0 - 35 U/L   Alkaline Phosphatase 155 (*) 39 - 117 U/L   Total Bilirubin 0.1 (*) 0.3 - 1.2 mg/dL   GFR calc non Af Amer >90  >90 mL/min   GFR calc Af Amer >90  >90 mL/min  LACTATE DEHYDROGENASE     Status: None   Collection Time    04/30/12  6:09 AM      Result Value Range   LDH 152  94 - 250 U/L  WET PREP, GENITAL     Status: Abnormal   Collection Time    04/30/12  6:30 AM      Result Value Range   Yeast Wet Prep HPF POC NONE SEEN  NONE SEEN   Trich, Wet Prep NONE SEEN  NONE SEEN   Clue Cells Wet Prep HPF POC NONE SEEN  NONE SEEN   WBC, Wet Prep HPF POC MODERATE (*) NONE SEEN   [place]: IMPRESSION:  1. Cervix is closed and measures 3.3 cm in thickness, although  there is some faint hypoechogenicity along the cervical canal.  2. Single living intrauterine pregnancy noted, currently in breech  presentation.   MAU Course  Procedures  MDM   Assessment and Plan  27 y/o [id number] at [redacted]w[redacted]d with hx of multipal SAB and 2 premature deliveries at 30 and 35 days presenting with contractions and hx of trace urine protein and episodic elevated blood pressures.  #  False Labor:  Pt mentions contractions since 0200 this am however, no cervical change was appreciated on cervical exam.   -- pt given procardia  -- u/s transvaginal for cervical length was 3.3 no significant change from previous -- d/c to home -- f/u w/ routine prenatal care  # Edema: Pt mentions ~ 7lbs weight gain since Monday with increased swelling of feet, hands and face.  Pre-Eclampsia: w/ hx of headache, vision changes, RUQ pain and swelling it is likely, however pt is normotensive with negative protein in her urine and normal LDH, protein creatinine ratio is still pending.  HELLP Syndrome: Pt's LFT's and platelets are normal, no rash on physical exam.    Gregor Hams 04/30/2012, 8:12 AM   .I have seen the patient with the resident/student and agree with the above.  Tawnya Crook

## 2012-04-30 NOTE — MAU Note (Signed)
Pt states contractions started at 2am and are about every 2-5 minutes apart. Denies leaking of fluid and vaginal bleeding. States that she has had a headache since yesterday and that she is seeing spots. Says that she is on 17p

## 2012-05-03 NOTE — MAU Provider Note (Signed)
Attestation of Attending Supervision of Advanced Practitioner (CNM/NP): Evaluation and management procedures were performed by the Advanced Practitioner under my supervision and collaboration.  I have reviewed the Advanced Practitioner's note and chart, and I agree with the management and plan.  Luticia Tadros 05/03/2012 8:41 AM   

## 2012-05-05 LAB — GC/CHLAMYDIA PROBE AMP
CT Probe RNA: NEGATIVE
GC Probe RNA: NEGATIVE

## 2012-05-10 ENCOUNTER — Ambulatory Visit (INDEPENDENT_AMBULATORY_CARE_PROVIDER_SITE_OTHER): Payer: Medicaid Other | Admitting: Obstetrics and Gynecology

## 2012-05-10 VITALS — BP 120/78 | Wt 243.4 lb

## 2012-05-10 DIAGNOSIS — IMO0002 Reserved for concepts with insufficient information to code with codable children: Secondary | ICD-10-CM

## 2012-05-10 DIAGNOSIS — O09299 Supervision of pregnancy with other poor reproductive or obstetric history, unspecified trimester: Secondary | ICD-10-CM

## 2012-05-10 DIAGNOSIS — O99019 Anemia complicating pregnancy, unspecified trimester: Secondary | ICD-10-CM

## 2012-05-10 DIAGNOSIS — O09219 Supervision of pregnancy with history of pre-term labor, unspecified trimester: Secondary | ICD-10-CM

## 2012-05-10 DIAGNOSIS — O0993 Supervision of high risk pregnancy, unspecified, third trimester: Secondary | ICD-10-CM

## 2012-05-10 DIAGNOSIS — O0910 Supervision of pregnancy with history of ectopic or molar pregnancy, unspecified trimester: Secondary | ICD-10-CM

## 2012-05-10 DIAGNOSIS — Z1389 Encounter for screening for other disorder: Secondary | ICD-10-CM

## 2012-05-10 DIAGNOSIS — Z331 Pregnant state, incidental: Secondary | ICD-10-CM

## 2012-05-10 LAB — POCT URINALYSIS DIPSTICK: Ketones, UA: NEGATIVE

## 2012-05-10 MED ORDER — ACYCLOVIR 400 MG PO TABS: 400.0000 mg | ORAL_TABLET | Freq: Two times a day (BID) | ORAL | Status: DC

## 2012-05-10 MED ORDER — HYDROXYPROGESTERONE CAPROATE 250 MG/ML IM OIL
250.0000 mg | TOPICAL_OIL | Freq: Once | INTRAMUSCULAR | Status: AC
  Administered 2012-05-10: 250 mg via INTRAMUSCULAR

## 2012-05-10 NOTE — Progress Notes (Signed)
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 CC: incr vag d/c and irreg contr.  Seen at [facility] 1 wk ago. rx'd procardia 10 x1, no further contrn. Cx soft long high -3 vtx. Posterior  IMP False Labor/Braxton Hicks.  P: routinge pnc.  17P TIL  36 wk. jvferg

## 2012-05-10 NOTE — Addendum Note (Signed)
Addended by: Criss Alvine on: 05/10/2012 02:54 PM   Modules accepted: Orders

## 2012-05-10 NOTE — Progress Notes (Signed)
Pt states increase of discharge, white and clear to appearance.

## 2012-05-14 ENCOUNTER — Inpatient Hospital Stay (HOSPITAL_COMMUNITY)
Admission: AD | Admit: 2012-05-14 | Discharge: 2012-05-18 | DRG: 765 | Disposition: A | Discharge status: Home / Self Care | Payer: Medicaid Other | Source: Ambulatory Visit | Attending: Obstetrics & Gynecology | Admitting: Obstetrics & Gynecology

## 2012-05-14 ENCOUNTER — Inpatient Hospital Stay (HOSPITAL_COMMUNITY): Payer: Medicaid Other

## 2012-05-14 ENCOUNTER — Telehealth: Payer: Self-pay | Admitting: Obstetrics and Gynecology

## 2012-05-14 ENCOUNTER — Encounter (HOSPITAL_COMMUNITY): Payer: Self-pay

## 2012-05-14 DIAGNOSIS — O1490 Unspecified pre-eclampsia, unspecified trimester: Secondary | ICD-10-CM | POA: Diagnosis present

## 2012-05-14 DIAGNOSIS — O321XX0 Maternal care for breech presentation, not applicable or unspecified: Secondary | ICD-10-CM

## 2012-05-14 DIAGNOSIS — O1493 Unspecified pre-eclampsia, third trimester: Secondary | ICD-10-CM

## 2012-05-14 DIAGNOSIS — O1414 Severe pre-eclampsia complicating childbirth: Principal | ICD-10-CM | POA: Diagnosis present

## 2012-05-14 DIAGNOSIS — IMO0002 Reserved for concepts with insufficient information to code with codable children: Secondary | ICD-10-CM

## 2012-05-14 DIAGNOSIS — O368190 Decreased fetal movements, unspecified trimester, not applicable or unspecified: Secondary | ICD-10-CM

## 2012-05-14 LAB — COMPREHENSIVE METABOLIC PANEL
Albumin: 2.4 g/dL — ABNORMAL LOW (ref 3.5–5.2)
BUN: 10 mg/dL (ref 6–23)
CO2: 24 mEq/L (ref 19–32)
Chloride: 99 mEq/L (ref 96–112)
Creatinine, Ser: 0.66 mg/dL (ref 0.50–1.10)
GFR calc Af Amer: >90 mL/min (ref >90)
GFR calc non Af Amer: >90 mL/min (ref >90)
Glucose, Bld: 102 mg/dL — ABNORMAL HIGH (ref 70–99)
Total Bilirubin: 0.1 mg/dL — ABNORMAL LOW (ref 0.3–1.2)

## 2012-05-14 LAB — PROTEIN / CREATININE RATIO, URINE: Creatinine, Urine: 248.55 mg/dL

## 2012-05-14 LAB — URINALYSIS, ROUTINE W REFLEX MICROSCOPIC
Protein, ur: 100 mg/dL — AB
Urobilinogen, UA: 0.2 mg/dL (ref 0.0–1.0)

## 2012-05-14 LAB — CBC
HCT: 36.7 % (ref 36.0–46.0)
Hemoglobin: 12.1 g/dL (ref 12.0–15.0)
MCV: 86.2 fL (ref 78.0–100.0)
RDW: 13.2 % (ref 11.5–15.5)
WBC: 11.2 10*3/uL — ABNORMAL HIGH (ref 4.0–10.5)

## 2012-05-14 LAB — URINE MICROSCOPIC-ADD ON

## 2012-05-14 MED ORDER — VALACYCLOVIR HCL 500 MG PO TABS
500.0000 mg | ORAL_TABLET | Freq: Two times a day (BID) | ORAL | Status: DC
  Administered 2012-05-14 – 2012-05-18 (×7): 500 mg via ORAL
  Filled 2012-05-14 (×11): qty 1

## 2012-05-14 MED ORDER — FOLIC ACID 800 MCG PO TABS: 400.0000 ug | ORAL_TABLET | Freq: Every day | ORAL | Status: DC

## 2012-05-14 MED ORDER — DOCUSATE SODIUM 100 MG PO CAPS
100.0000 mg | ORAL_CAPSULE | Freq: Every day | ORAL | Status: DC
  Filled 2012-05-14 (×2): qty 1

## 2012-05-14 MED ORDER — LABETALOL HCL 5 MG/ML IV SOLN
10.0000 mg | INTRAVENOUS | Status: DC | PRN
  Filled 2012-05-14: qty 4

## 2012-05-14 MED ORDER — ZOLPIDEM TARTRATE 5 MG PO TABS
5.0000 mg | ORAL_TABLET | Freq: Every evening | ORAL | Status: DC | PRN
  Administered 2012-05-14: 5 mg via ORAL
  Filled 2012-05-14: qty 1

## 2012-05-14 MED ORDER — ACETAMINOPHEN 325 MG PO TABS
650.0000 mg | ORAL_TABLET | ORAL | Status: DC | PRN
  Administered 2012-05-15 (×2): 650 mg via ORAL
  Filled 2012-05-14 (×2): qty 2

## 2012-05-14 MED ORDER — PRENATAL MULTIVITAMIN CH
1.0000 | ORAL_TABLET | Freq: Every day | ORAL | Status: DC
  Administered 2012-05-15 – 2012-05-17 (×2): 1 via ORAL
  Filled 2012-05-14 (×2): qty 1

## 2012-05-14 MED ORDER — SODIUM CHLORIDE 0.9 % IJ SOLN: 3.0000 mL | INTRAMUSCULAR | Status: DC | PRN

## 2012-05-14 MED ORDER — HYDROXYPROGESTERONE CAPROATE 250 MG/ML IM OIL: 250.0000 mg | TOPICAL_OIL | INTRAMUSCULAR | Status: DC

## 2012-05-14 MED ORDER — SODIUM CHLORIDE 0.9 % IV SOLN: 250.0000 mL | INTRAVENOUS | Status: DC | PRN

## 2012-05-14 MED ORDER — FOLIC ACID 1 MG PO TABS
1.0000 mg | ORAL_TABLET | Freq: Every day | ORAL | Status: DC
  Administered 2012-05-15: 1 mg via ORAL
  Filled 2012-05-14 (×3): qty 1

## 2012-05-14 MED ORDER — CALCIUM CARBONATE ANTACID 500 MG PO CHEW: 2.0000 | CHEWABLE_TABLET | ORAL | Status: DC | PRN

## 2012-05-14 MED ORDER — SODIUM CHLORIDE 0.9 % IJ SOLN
3.0000 mL | Freq: Two times a day (BID) | INTRAMUSCULAR | Status: DC
  Administered 2012-05-14 – 2012-05-15 (×2): 3 mL via INTRAVENOUS

## 2012-05-14 NOTE — H&P (Signed)
History   Pt is a 27 y/o [id number] at [redacted]w[redacted]d with hx of swelling and trace protein presenting with increased edema and weight gain  CSN: [id number]  Arrival date and time: 05/14/12 8657   None     Chief Complaint  Patient presents with  . Contractions   HPI # Edema: Pt mentions continued worsening of lower extremity edema and tightness of skin in lower extremities over the last 4-5 days compared to evaluation on 04/30/12 as well as swelling of hands and feet.  Edema is associated with lower extremity weakness and pain.  Shortness of breath with walking and at rest described as only able to walk 10-15 feet before becoming short of breath.  Pt denies chest pain, palpitations, RUQ pain. Nothing makes the swelling better or worse, nothing tried.  Pt mentions intolerance to heat, denies hair loss, skin discoloration.    OB History   Grav Para Term Preterm Abortions TAB SAB Ect Mult Living   9 2  2 6  5 1  2       Past Medical History  Diagnosis Date  . No pertinent past medical history   . Incompetent cervix in pregnancy   . Abnormal Pap smear     Past Surgical History  Procedure Laterality Date  . Tonsillectomy    . Cervical biopsy    . Breast surgery    . Laparoscopy for ectopic pregnancy  03/10/2011    Left Linear Salpingostomy,   . Dilation and curettage of uterus    . Cervical cerclage N/A 2006  . Ectopic pregnancy surgery Left 2013    Family History  Problem Relation Age of Onset  . Endometriosis Other   . Diabetes Other   . Hypertension Other     History  Substance Use Topics  . Smoking status: Never Smoker   . Smokeless tobacco: Not on file  . Alcohol Use: No     Comment: occasionally    Allergies:  Allergies  Allergen Reactions  . Alprazolam     UNKNOWN REACTION: Placed in Hospital  . Latex     Contraceptive    Prescriptions prior to admission  Medication Sig Dispense Refill  . acetaminophen (TYLENOL) 500 MG tablet Take 500 mg by mouth every 6 (six)  hours as needed for pain.      . calcium carbonate (TUMS - DOSED IN MG ELEMENTAL CALCIUM) 500 MG chewable tablet Chew 1-2 tablets by mouth daily as needed.       . folic acid (FOLVITE) 800 MCG tablet Take 400 mcg by mouth daily.      . Prenatal Vit-Fe Fumarate-FA (PRENATAL S PO) Take 1 tablet by mouth daily.       . valACYclovir (VALTREX) 500 MG tablet Take 500 mg by mouth 2 (two) times daily.      . hydroxyprogesterone caproate (DELALUTIN) 250 mg/mL OIL Inject 250 mg into the muscle once a week.        Review of Systems  Constitutional: Positive for malaise/fatigue and diaphoresis. Negative for fever, chills and weight loss.  HENT: Negative for congestion and sore throat.   Eyes: Negative for blurred vision and double vision.  Respiratory: Negative for cough, shortness of breath and wheezing.   Cardiovascular: Positive for orthopnea and leg swelling. Negative for chest pain, palpitations and claudication.  Gastrointestinal: Positive for abdominal pain and constipation. Negative for heartburn, nausea, vomiting and diarrhea.  Genitourinary: Negative for dysuria, urgency and flank pain.  Musculoskeletal: Negative for myalgias and  joint pain.  Skin: Negative for itching and rash.  Neurological: Positive for weakness. Negative for dizziness, tingling, seizures and headaches.  Endo/Heme/Allergies: Does not bruise/bleed easily.  Psychiatric/Behavioral: Negative for depression.  All other systems reviewed and are negative.   Physical Exam   Blood pressure 137/92, pulse 90, resp. rate 18, height 5\' 4"  (1.626 m), weight 112.946 kg (249 lb), last menstrual period 09/12/2011, SpO2 100.00%.  Physical Exam  Vitals reviewed. Constitutional: She is oriented to person, place, and time. She appears well-developed and well-nourished.  HENT:  Head: Normocephalic and atraumatic.  Eyes: EOM are normal. Pupils are equal, round, and reactive to light.  Neck: Normal range of motion. Neck supple.   Cardiovascular: Normal rate, regular rhythm, normal heart sounds, intact distal pulses and normal pulses.  Exam reveals no gallop and no friction rub.   No murmur heard. JVD at 90 degrees  Respiratory: Effort normal and breath sounds normal. No respiratory distress. She has no wheezes. She has no rales.  GI: Soft. Bowel sounds are normal. There is tenderness. There is no rebound.  Genitourinary:  Cervical exam: long/thick/closed  Musculoskeletal: Normal range of motion. She exhibits edema. She exhibits no tenderness.  Neurological: She is alert and oriented to person, place, and time. She has normal reflexes.  Skin: Skin is warm and dry. No rash noted. She is not diaphoretic. No erythema.  Psychiatric: She has a normal mood and affect. Her behavior is normal. Thought content normal.    NST: Baseline rate: 145: accelerations present, no dececerlation, moderate variability, category 1 tracing  Results for orders placed during the hospital encounter of 05/14/12 (from the past 24 hour(s))  URINALYSIS, ROUTINE W REFLEX MICROSCOPIC     Status: Abnormal   Collection Time    05/14/12  5:00 PM      Result Value Range   Color, Urine YELLOW  YELLOW   APPearance HAZY (*) CLEAR   Specific Gravity, Urine >1.030 (*) 1.005 - 1.030   pH 6.0  5.0 - 8.0   Glucose, UA NEGATIVE  NEGATIVE mg/dL   Hgb urine dipstick SMALL (*) NEGATIVE   Bilirubin Urine NEGATIVE  NEGATIVE   Ketones, ur NEGATIVE  NEGATIVE mg/dL   Protein, ur 914 (*) NEGATIVE mg/dL   Urobilinogen, UA 0.2  0.0 - 1.0 mg/dL   Nitrite NEGATIVE  NEGATIVE   Leukocytes, UA SMALL (*) NEGATIVE  PROTEIN / CREATININE RATIO, URINE     Status: Abnormal   Collection Time    05/14/12  5:00 PM      Result Value Range   Creatinine, Urine 248.55     Total Protein, Urine 119.5     PROTEIN CREATININE RATIO 0.48 (*) 0.00 - 0.15  URINE MICROSCOPIC-ADD ON     Status: Abnormal   Collection Time    05/14/12  5:00 PM      Result Value Range   Squamous  Epithelial / LPF MANY (*) RARE   WBC, UA 21-50  <3 WBC/hpf   RBC / HPF 0-2  <3 RBC/hpf   Bacteria, UA FEW (*) RARE  CBC     Status: Abnormal   Collection Time    05/14/12  5:39 PM      Result Value Range   WBC 11.2 (*) 4.0 - 10.5 K/uL   RBC 4.26  3.87 - 5.11 MIL/uL   Hemoglobin 12.1  12.0 - 15.0 g/dL   HCT 78.2  95.6 - 21.3 %   MCV 86.2  78.0 - 100.0 fL  MCH 28.4  26.0 - 34.0 pg   MCHC 33.0  30.0 - 36.0 g/dL   RDW 78.2  95.6 - 21.3 %   Platelets 317  150 - 400 K/uL  COMPREHENSIVE METABOLIC PANEL     Status: Abnormal   Collection Time    05/14/12  5:39 PM      Result Value Range   Sodium 135  135 - 145 mEq/L   Potassium 4.0  3.5 - 5.1 mEq/L   Chloride 99  96 - 112 mEq/L   CO2 24  19 - 32 mEq/L   Glucose, Bld 102 (*) 70 - 99 mg/dL   BUN 10  6 - 23 mg/dL   Creatinine, Ser 0.86  0.50 - 1.10 mg/dL   Calcium 9.1  8.4 - 57.8 mg/dL   Total Protein 6.5  6.0 - 8.3 g/dL   Albumin 2.4 (*) 3.5 - 5.2 g/dL   AST 14  0 - 37 U/L   ALT 17  0 - 35 U/L   Alkaline Phosphatase 159 (*) 39 - 117 U/L   Total Bilirubin 0.1 (*) 0.3 - 1.2 mg/dL   GFR calc non Af Amer >90  >90 mL/min   GFR calc Af Amer >90  >90 mL/min     MAU Course  Procedures  MDM   Assessment and Plan  Pt is a 27 y/o [id number] at [redacted]w[redacted]d with hx of swelling and trace protein presenting with increased edema and weight gain  # Gestational hypertension: Pt presents with concern for continued edema wit shortness of breath and difficulty breathing with mildly elevated blood pressures.  Differential: Pre-eclampsia: Concern with edema and elevated blood pressures, pt denies headache, blurry vision, RUQ pain, with 1+ protein on urinalysis and protein/creatinine ratio 0.45.  Peripardum cardiomyopathy: with complaint of edema and shortness of breath concern for cardiac related pathology, elevated JVD, but no crackles on lung exam with minimal lower extremity pitting edema with normal EKG.  Thyrotoxicosis: pt mentions increased heat  intolerance with lethargy and edema THS pending: pt has  3 lbs weight gain since 04/30/12.   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   DISPO: Diet: full Activity: up ab lib Nursing: Per floor protocol Electrolytes: replace as needed Prophylaxis: GI: none; DVT SCD's Code: Full  Ancipitate the pt to be admitted for 2 midnight(s) for management of pre-eclamptsia.      Gregor Hams 05/14/2012, 6:59 PM

## 2012-05-14 NOTE — MAU Provider Note (Signed)
History   Pt is a 27 y/o [id number] at [redacted]w[redacted]d with hx of swelling and trace protein presenting with increased edema and weight gain  CSN: [id number]  Arrival date and time: 05/14/12 1478   None     Chief Complaint  Patient presents with  . Contractions   HPI # Edema: Pt mentions continued worsening of lower extremity edema and tightness of skin in lower extremities over the last 4-5 days compared to evaluation on 04/30/12 as well as swelling of hands and feet.  Edema is associated with lower extremity weakness and pain.  Shortness of breath with walking and at rest described as only able to walk 10-15 feet before becoming short of breath.  Pt denies chest pain, palpitations, RUQ pain. Nothing makes the swelling better or worse, nothing tried.  Pt mentions intolerance to heat, denies hair loss, skin discoloration.    OB History   Grav Para Term Preterm Abortions TAB SAB Ect Mult Living   9 2  2 6  5 1  2       Past Medical History  Diagnosis Date  . No pertinent past medical history   . Incompetent cervix in pregnancy   . Abnormal Pap smear     Past Surgical History  Procedure Laterality Date  . Tonsillectomy    . Cervical biopsy    . Breast surgery    . Laparoscopy for ectopic pregnancy  03/10/2011    Left Linear Salpingostomy,   . Dilation and curettage of uterus    . Cervical cerclage N/A 2006  . Ectopic pregnancy surgery Left 2013    Family History  Problem Relation Age of Onset  . Endometriosis Other   . Diabetes Other   . Hypertension Other     History  Substance Use Topics  . Smoking status: Never Smoker   . Smokeless tobacco: Not on file  . Alcohol Use: No     Comment: occasionally    Allergies:  Allergies  Allergen Reactions  . Alprazolam     UNKNOWN REACTION: Placed in Hospital  . Latex     Contraceptive    Prescriptions prior to admission  Medication Sig Dispense Refill  . acetaminophen (TYLENOL) 500 MG tablet Take 500 mg by mouth every 6 (six)  hours as needed for pain.      . calcium carbonate (TUMS - DOSED IN MG ELEMENTAL CALCIUM) 500 MG chewable tablet Chew 1-2 tablets by mouth daily as needed.       . folic acid (FOLVITE) 800 MCG tablet Take 400 mcg by mouth daily.      . Prenatal Vit-Fe Fumarate-FA (PRENATAL S PO) Take 1 tablet by mouth daily.       . valACYclovir (VALTREX) 500 MG tablet Take 500 mg by mouth 2 (two) times daily.      . hydroxyprogesterone caproate (DELALUTIN) 250 mg/mL OIL Inject 250 mg into the muscle once a week.        Review of Systems  Constitutional: Positive for malaise/fatigue and diaphoresis. Negative for fever, chills and weight loss.  HENT: Negative for congestion and sore throat.   Eyes: Negative for blurred vision and double vision.  Respiratory: Negative for cough, shortness of breath and wheezing.   Cardiovascular: Positive for orthopnea and leg swelling. Negative for chest pain, palpitations and claudication.  Gastrointestinal: Positive for abdominal pain and constipation. Negative for heartburn, nausea, vomiting and diarrhea.  Genitourinary: Negative for dysuria, urgency and flank pain.  Musculoskeletal: Negative for myalgias and joint  pain.  Skin: Negative for itching and rash.  Neurological: Positive for weakness. Negative for dizziness, tingling, seizures and headaches.  Endo/Heme/Allergies: Does not bruise/bleed easily.  Psychiatric/Behavioral: Negative for depression.  All other systems reviewed and are negative.   Physical Exam   Blood pressure 137/92, pulse 90, resp. rate 18, height 5\' 4"  (1.626 m), weight 112.946 kg (249 lb), last menstrual period 09/12/2011, SpO2 100.00%.  Physical Exam  Vitals reviewed. Constitutional: She is oriented to person, place, and time. She appears well-developed and well-nourished.  HENT:  Head: Normocephalic and atraumatic.  Eyes: EOM are normal. Pupils are equal, round, and reactive to light.  Neck: Normal range of motion. Neck supple.   Cardiovascular: Normal rate, regular rhythm, normal heart sounds, intact distal pulses and normal pulses.  Exam reveals no gallop and no friction rub.   No murmur heard. JVD at 90 degrees  Respiratory: Effort normal and breath sounds normal. No respiratory distress. She has no wheezes. She has no rales.  GI: Soft. Bowel sounds are normal. There is tenderness. There is no rebound.  Genitourinary:  Cervical exam: long/thick/closed  Musculoskeletal: Normal range of motion. She exhibits edema. She exhibits no tenderness.  Neurological: She is alert and oriented to person, place, and time. She has normal reflexes.  Skin: Skin is warm and dry. No rash noted. She is not diaphoretic. No erythema.  Psychiatric: She has a normal mood and affect. Her behavior is normal. Thought content normal.   Results for orders placed during the hospital encounter of 05/14/12 (from the past 24 hour(s))  URINALYSIS, ROUTINE W REFLEX MICROSCOPIC     Status: Abnormal   Collection Time    05/14/12  5:00 PM      Result Value Range   Color, Urine YELLOW  YELLOW   APPearance HAZY (*) CLEAR   Specific Gravity, Urine >1.030 (*) 1.005 - 1.030   pH 6.0  5.0 - 8.0   Glucose, UA NEGATIVE  NEGATIVE mg/dL   Hgb urine dipstick SMALL (*) NEGATIVE   Bilirubin Urine NEGATIVE  NEGATIVE   Ketones, ur NEGATIVE  NEGATIVE mg/dL   Protein, ur 161 (*) NEGATIVE mg/dL   Urobilinogen, UA 0.2  0.0 - 1.0 mg/dL   Nitrite NEGATIVE  NEGATIVE   Leukocytes, UA SMALL (*) NEGATIVE  URINE MICROSCOPIC-ADD ON     Status: Abnormal   Collection Time    05/14/12  5:00 PM      Result Value Range   Squamous Epithelial / LPF MANY (*) RARE   WBC, UA 21-50  <3 WBC/hpf   RBC / HPF 0-2  <3 RBC/hpf   Bacteria, UA FEW (*) RARE  CBC     Status: Abnormal   Collection Time    05/14/12  5:39 PM      Result Value Range   WBC 11.2 (*) 4.0 - 10.5 K/uL   RBC 4.26  3.87 - 5.11 MIL/uL   Hemoglobin 12.1  12.0 - 15.0 g/dL   HCT 09.6  04.5 - 40.9 %   MCV 86.2   78.0 - 100.0 fL   MCH 28.4  26.0 - 34.0 pg   MCHC 33.0  30.0 - 36.0 g/dL   RDW 81.1  91.4 - 78.2 %   Platelets 317  150 - 400 K/uL  COMPREHENSIVE METABOLIC PANEL     Status: Abnormal   Collection Time    05/14/12  5:39 PM      Result Value Range   Sodium 135  135 - 145  mEq/L   Potassium 4.0  3.5 - 5.1 mEq/L   Chloride 99  96 - 112 mEq/L   CO2 24  19 - 32 mEq/L   Glucose, Bld 102 (*) 70 - 99 mg/dL   BUN 10  6 - 23 mg/dL   Creatinine, Ser 7.82  0.50 - 1.10 mg/dL   Calcium 9.1  8.4 - 95.6 mg/dL   Total Protein 6.5  6.0 - 8.3 g/dL   Albumin 2.4 (*) 3.5 - 5.2 g/dL   AST 14  0 - 37 U/L   ALT 17  0 - 35 U/L   Alkaline Phosphatase 159 (*) 39 - 117 U/L   Total Bilirubin 0.1 (*) 0.3 - 1.2 mg/dL   GFR calc non Af Amer >90  >90 mL/min   GFR calc Af Amer >90  >90 mL/min   NST: Baseline rate: 145: accelerations present, no dececerlation, moderate variability, category 1 tracing  MAU Course  Procedures  MDM   Assessment and Plan  Pt is a 27 y/o [id number] at [redacted]w[redacted]d with hx of swelling and trace protein presenting with increased edema and weight gain  # Gestational hypertension: Pt presents with concern for continued edema wit shortness of breath and difficulty breathing with mildly elevated blood pressures.  Differential: Pre-eclampsia: Concern with edema and elevated blood pressures, pt denies headache, blurry vision, RUQ pain, with 1+ protein on urinalysis and protein/creatinine ratio 0.45.  Peripardum cardiomyopathy: with complaint of edema and shortness of breath concern for cardiac related pathology, elevated JVD, but no crackles on lung exam with minimal lower extremity pitting edema with normal EKG.  Thyrotoxicosis: pt mentions increased heat intolerance with lethargy and edema THS pending: pt has  3 lbs weight gain since 04/30/12.   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   DISPO: Diet: full Activity: up ab lib Nursing: Per floor protocol Electrolytes: replace as needed Prophylaxis: GI: none; DVT SCD's Code: Full  Ancipitate the pt to be admitted for 2 midnight(s) for management of pre-eclamptsia.      Gregor Hams 05/14/2012, 6:59 PM   Seen also by me Agree with note Wynelle Bourgeois CNM

## 2012-05-14 NOTE — MAU Note (Addendum)
Been contractions since 0130, not as close now.  Pain on left side. Increase swelling in lower legs.  Lost mucous plug yesterday morning.  Feel like can't catch breath today.

## 2012-05-14 NOTE — Telephone Encounter (Signed)
Pt encouraged to push fluids, decrease salt intake, continue to elevate extremities. Pt has appt for Monday, May 17, 2012 @ 9:45

## 2012-05-15 DIAGNOSIS — O1490 Unspecified pre-eclampsia, unspecified trimester: Secondary | ICD-10-CM | POA: Diagnosis present

## 2012-05-15 NOTE — Progress Notes (Signed)
Faculty Practice OB/GYN Attending Note  Subjective:  Called to evaluate patient with complaint of SOB and severe BLE edema.  O2 saturation is 99%, HR 69. No chest pain, headache, blurry vision, RUQ/epigastric pain  or other symptoms.  FHR reassuring, no contractions, no LOF or vaginal bleeding. Good FM.   Admitted on 05/14/2012 for Preeclampsia.   Objective:  Blood pressure 157/93, pulse 69, temperature 98.3 F (36.8 C), temperature source Oral, resp. rate 18, height 5\' 4"  (1.626 m), weight 248 lb 9.6 oz (112.764 kg), last menstrual period 09/12/2011, SpO2 95.00%. Temp:  [98.1 F (36.7 C)-98.3 F (36.8 C)] 98.3 F (36.8 C) (03/29 1250) Pulse Rate:  [69-93] 69 (03/29 1251) Resp:  [18] 18 (03/29 1251) BP: (128-157)/(74-95) 157/93 mmHg (03/29 1251) SpO2:  [95 %-100 %] 95 % (03/28 1817) Weight:  [248 lb 9.6 oz (112.764 kg)-249 lb (112.946 kg)] 248 lb 9.6 oz (112.764 kg) (03/29 1013) FHT  Baseline 130 bpm, moderate variability, +accelerations, no decelerations Toco: none Gen: NAD Lungs: CTAB, no evidence of pulmonary edema Abdomen: NT gravid fundus, soft Cervix: Deferred Ext: 2+ DTRs,  3+ symmetric BLE, no cyanosis, negative Homan's sign  Assessment & Plan:  27 y.o. [id number] at [redacted]w[redacted]d admitted for preclampsia. No indication for antihypertensive/diuretic for now, do not want to mask increasing BP.  Discussed that if she progresses to severe preeclampsia, this would be an indication for delivery; reviewed signs/symptoms of severe preeclampsia.  Will continue close observation.  Jaynie Collins, MD, FACOG Attending Obstetrician & Gynecologist Faculty Practice, [facility]

## 2012-05-15 NOTE — Progress Notes (Signed)
Patient ID: [name], female   DOB: 05-16-1985, 27 y.o.   MRN: [id number] FACULTY PRACTICE ANTEPARTUM(COMPREHENSIVE) NOTE  [name] is a 27 y.o. [id number] at [redacted]w[redacted]d by LMP who is admitted for preeclampsia.   Fetal presentation is breech. Length of Stay:  1  Days  Subjective: Decreased movement Patient reports the fetal movement as decreased . Patient reports uterine contraction  activity as none. Patient reports  vaginal bleeding as none. Patient describes fluid per vagina as None.  Vitals:  Blood pressure 141/87, pulse 80, temperature 98.1 F (36.7 C), temperature source Oral, resp. rate 18, height 5\' 4"  (1.626 m), weight 249 lb (112.946 kg), last menstrual period 09/12/2011, SpO2 95.00%. Physical Examination:  General appearance - alert, well appearing, and in no distress Heart - normal rate and regular rhythm Abdomen - soft, nontender, nondistended Fundal Height:  C/w dates. Extremities: 2-3+ edema LE Membranes:intact  Fetal Monitoring:  Baseline: 140 bpm, Variability: Good {> 6 bpm), Accelerations: Reactive and Decelerations: Absent  Labs:  Results for orders placed during the hospital encounter of 05/14/12 (from the past 24 hour(s))  URINALYSIS, ROUTINE W REFLEX MICROSCOPIC   Collection Time    05/14/12  5:00 PM      Result Value Range   Color, Urine YELLOW  YELLOW   APPearance HAZY (*) CLEAR   Specific Gravity, Urine >1.030 (*) 1.005 - 1.030   pH 6.0  5.0 - 8.0   Glucose, UA NEGATIVE  NEGATIVE mg/dL   Hgb urine dipstick SMALL (*) NEGATIVE   Bilirubin Urine NEGATIVE  NEGATIVE   Ketones, ur NEGATIVE  NEGATIVE mg/dL   Protein, ur 272 (*) NEGATIVE mg/dL   Urobilinogen, UA 0.2  0.0 - 1.0 mg/dL   Nitrite NEGATIVE  NEGATIVE   Leukocytes, UA SMALL (*) NEGATIVE  PROTEIN / CREATININE RATIO, URINE   Collection Time    05/14/12  5:00 PM      Result Value Range   Creatinine, Urine 248.55     Total Protein, Urine 119.5     PROTEIN CREATININE RATIO 0.48 (*) 0.00 - 0.15   URINE MICROSCOPIC-ADD ON   Collection Time    05/14/12  5:00 PM      Result Value Range   Squamous Epithelial / LPF MANY (*) RARE   WBC, UA 21-50  <3 WBC/hpf   RBC / HPF 0-2  <3 RBC/hpf   Bacteria, UA FEW (*) RARE  CBC   Collection Time    05/14/12  5:39 PM      Result Value Range   WBC 11.2 (*) 4.0 - 10.5 K/uL   RBC 4.26  3.87 - 5.11 MIL/uL   Hemoglobin 12.1  12.0 - 15.0 g/dL   HCT 53.6  64.4 - 03.4 %   MCV 86.2  78.0 - 100.0 fL   MCH 28.4  26.0 - 34.0 pg   MCHC 33.0  30.0 - 36.0 g/dL   RDW 74.2  59.5 - 63.8 %   Platelets 317  150 - 400 K/uL  COMPREHENSIVE METABOLIC PANEL   Collection Time    05/14/12  5:39 PM      Result Value Range   Sodium 135  135 - 145 mEq/L   Potassium 4.0  3.5 - 5.1 mEq/L   Chloride 99  96 - 112 mEq/L   CO2 24  19 - 32 mEq/L   Glucose, Bld 102 (*) 70 - 99 mg/dL   BUN 10  6 - 23 mg/dL   Creatinine, Ser 7.56  0.50 - 1.10 mg/dL   Calcium 9.1  8.4 - 40.9 mg/dL   Total Protein 6.5  6.0 - 8.3 g/dL   Albumin 2.4 (*) 3.5 - 5.2 g/dL   AST 14  0 - 37 U/L   ALT 17  0 - 35 U/L   Alkaline Phosphatase 159 (*) 39 - 117 U/L   Total Bilirubin 0.1 (*) 0.3 - 1.2 mg/dL   GFR calc non Af Amer >90  >90 mL/min   GFR calc Af Amer >90  >90 mL/min  TSH   Collection Time    05/14/12  5:39 PM      Result Value Range   TSH 1.555  0.350 - 4.500 uIU/mL    Imaging Studies:     Nl AFI, breech, marginal cord insertion, 50%ile  Medications:  Scheduled . docusate sodium  100 mg Oral Daily  . folic acid  1 mg Oral Daily  . [START ON 05/17/2012] hydroxyprogesterone caproate  250 mg Intramuscular Weekly  . prenatal multivitamin  1 tablet Oral Q1200  . sodium chloride  3 mL Intravenous Q12H  . valACYclovir  500 mg Oral BID   I have reviewed the patient's current medications.  ASSESSMENT: 35 week mild preeclampsia   PLAN: 24 hr urine and observe for /sx severe pre-e  [name] 05/15/2012,7:20 AM

## 2012-05-15 NOTE — H&P (Signed)
Agree with resident note  Adam Phenix, MD .

## 2012-05-15 NOTE — Progress Notes (Signed)
24 hour urine collected, labeled and taken to lab.

## 2012-05-15 NOTE — Progress Notes (Signed)
Pt. Complains of "Unable to get my breath good and chest feel tight."  Called Dr. Macon Large to notify.  Pt. Not in any distress at present.

## 2012-05-15 NOTE — Plan of Care (Signed)
Problem: Consults Goal: Birthing Suites Patient Information Press F2 to bring up selections list Outcome: Completed/Met Date Met:  05/15/12  Pt < [redacted] weeks EGA and PIH (Pregnancy induced hypertension)

## 2012-05-16 ENCOUNTER — Encounter (HOSPITAL_COMMUNITY)
Admission: AD | Disposition: A | Discharge status: Home / Self Care | Payer: Self-pay | Source: Ambulatory Visit | Attending: Obstetrics & Gynecology

## 2012-05-16 ENCOUNTER — Inpatient Hospital Stay (HOSPITAL_COMMUNITY): Payer: Medicaid Other

## 2012-05-16 ENCOUNTER — Encounter (HOSPITAL_COMMUNITY): Payer: Self-pay

## 2012-05-16 DIAGNOSIS — IMO0002 Reserved for concepts with insufficient information to code with codable children: Secondary | ICD-10-CM

## 2012-05-16 LAB — URINE CULTURE: Colony Count: 40000

## 2012-05-16 LAB — CBC
HCT: 34.9 % — ABNORMAL LOW (ref 36.0–46.0)
Hemoglobin: 11.5 g/dL — ABNORMAL LOW (ref 12.0–15.0)
MCH: 28.6 pg (ref 26.0–34.0)
MCHC: 33 g/dL (ref 30.0–36.0)
MCV: 86.8 fL (ref 78.0–100.0)
Platelets: 310 K/uL (ref 150–400)
RBC: 4.02 MIL/uL (ref 3.87–5.11)
RDW: 13.2 % (ref 11.5–15.5)
WBC: 10 K/uL (ref 4.0–10.5)

## 2012-05-16 LAB — CREATININE CLEARANCE, URINE, 24 HOUR
Creatinine Clearance: 229 mL/min — ABNORMAL HIGH (ref 75–115)
Creatinine, 24H Ur: 2174 mg/d — ABNORMAL HIGH (ref 700–1800)
Creatinine, Urine: 167.26 mg/dL
Creatinine: 0.66 mg/dL (ref 0.50–1.10)

## 2012-05-16 LAB — COMPREHENSIVE METABOLIC PANEL
AST: 13 U/L (ref 0–37)
Albumin: 2.1 g/dL — ABNORMAL LOW (ref 3.5–5.2)
BUN: 9 mg/dL (ref 6–23)
Calcium: 8.5 mg/dL (ref 8.4–10.5)
Chloride: 102 mEq/L (ref 96–112)
Creatinine, Ser: 0.52 mg/dL (ref 0.50–1.10)
GFR calc non Af Amer: >90 mL/min (ref >90)
Total Bilirubin: 0.1 mg/dL — ABNORMAL LOW (ref 0.3–1.2)

## 2012-05-16 LAB — PROTEIN, URINE, 24 HOUR
Collection Interval-UPROT: 24 h
Protein, 24H Urine: 806 mg/d — ABNORMAL HIGH (ref 50–100)
Protein, Urine: 62 mg/dL
Urine Total Volume-UPROT: 1300 mL

## 2012-05-16 SURGERY — Surgical Case
Anesthesia: Spinal | Site: Abdomen | Wound class: Clean Contaminated

## 2012-05-16 MED ORDER — ONDANSETRON HCL 4 MG/2ML IJ SOLN
INTRAMUSCULAR | Status: DC | PRN
  Administered 2012-05-16: 4 mg via INTRAVENOUS

## 2012-05-16 MED ORDER — TETANUS-DIPHTH-ACELL PERTUSSIS 5-2.5-18.5 LF-MCG/0.5 IM SUSP
0.5000 mL | Freq: Once | INTRAMUSCULAR | Status: DC
  Filled 2012-05-16: qty 0.5

## 2012-05-16 MED ORDER — SCOPOLAMINE 1 MG/3DAYS TD PT72
MEDICATED_PATCH | TRANSDERMAL | Status: AC
  Administered 2012-05-16: 1.5 mg via TRANSDERMAL
  Filled 2012-05-16: qty 1

## 2012-05-16 MED ORDER — KETOROLAC TROMETHAMINE 30 MG/ML IJ SOLN: 15.0000 mg | Freq: Once | INTRAMUSCULAR | Status: DC | PRN

## 2012-05-16 MED ORDER — EPHEDRINE 5 MG/ML INJ
INTRAVENOUS | Status: AC
  Filled 2012-05-16: qty 10

## 2012-05-16 MED ORDER — MORPHINE SULFATE 0.5 MG/ML IJ SOLN
INTRAMUSCULAR | Status: AC
  Filled 2012-05-16: qty 10

## 2012-05-16 MED ORDER — ONDANSETRON HCL 4 MG/2ML IJ SOLN
INTRAMUSCULAR | Status: AC
  Filled 2012-05-16: qty 2

## 2012-05-16 MED ORDER — MORPHINE SULFATE (PF) 0.5 MG/ML IJ SOLN
INTRAMUSCULAR | Status: DC | PRN
  Administered 2012-05-16: .1 mg via INTRATHECAL

## 2012-05-16 MED ORDER — MAGNESIUM SULFATE 40 G IN LACTATED RINGERS - SIMPLE
2.0000 g/h | INTRAVENOUS | Status: AC
  Administered 2012-05-16 – 2012-05-17 (×2): 2 g/h via INTRAVENOUS
  Filled 2012-05-16 (×2): qty 500

## 2012-05-16 MED ORDER — CEFAZOLIN SODIUM-DEXTROSE 2-3 GM-% IV SOLR
INTRAVENOUS | Status: AC
  Filled 2012-05-16: qty 50

## 2012-05-16 MED ORDER — MAGNESIUM SULFATE BOLUS VIA INFUSION
4.0000 g | Freq: Once | INTRAVENOUS | Status: DC
  Filled 2012-05-16: qty 500

## 2012-05-16 MED ORDER — CITRIC ACID-SODIUM CITRATE 334-500 MG/5ML PO SOLN
ORAL | Status: AC
  Administered 2012-05-16: 30 mL
  Filled 2012-05-16: qty 15

## 2012-05-16 MED ORDER — ONDANSETRON HCL 4 MG PO TABS: 4.0000 mg | ORAL_TABLET | ORAL | Status: DC | PRN

## 2012-05-16 MED ORDER — SODIUM CHLORIDE 0.9 % IJ SOLN: 3.0000 mL | INTRAMUSCULAR | Status: DC | PRN

## 2012-05-16 MED ORDER — DIPHENHYDRAMINE HCL 25 MG PO CAPS
25.0000 mg | ORAL_CAPSULE | Freq: Four times a day (QID) | ORAL | Status: DC | PRN
  Administered 2012-05-16: 25 mg via ORAL

## 2012-05-16 MED ORDER — SODIUM CHLORIDE 0.9 % IR SOLN
Status: DC | PRN
  Administered 2012-05-16: 1000 mL

## 2012-05-16 MED ORDER — LANOLIN HYDROUS EX OINT: 1.0000 "application " | TOPICAL_OINTMENT | CUTANEOUS | Status: DC | PRN

## 2012-05-16 MED ORDER — IBUPROFEN 600 MG PO TABS
600.0000 mg | ORAL_TABLET | Freq: Four times a day (QID) | ORAL | Status: DC
  Administered 2012-05-16 – 2012-05-18 (×8): 600 mg via ORAL
  Filled 2012-05-16 (×8): qty 1

## 2012-05-16 MED ORDER — LACTATED RINGERS IV SOLN
INTRAVENOUS | Status: DC | PRN
  Administered 2012-05-16 (×2): via INTRAVENOUS

## 2012-05-16 MED ORDER — LACTATED RINGERS IV SOLN
INTRAVENOUS | Status: AC
  Administered 2012-05-16 – 2012-05-17 (×2): via INTRAVENOUS

## 2012-05-16 MED ORDER — CEFAZOLIN SODIUM-DEXTROSE 2-3 GM-% IV SOLR
INTRAVENOUS | Status: DC | PRN
  Administered 2012-05-16: 2 g via INTRAVENOUS

## 2012-05-16 MED ORDER — FENTANYL CITRATE 0.05 MG/ML IJ SOLN
INTRAMUSCULAR | Status: AC
  Filled 2012-05-16: qty 2

## 2012-05-16 MED ORDER — MENTHOL 3 MG MT LOZG: 1.0000 | LOZENGE | OROMUCOSAL | Status: DC | PRN

## 2012-05-16 MED ORDER — KETOROLAC TROMETHAMINE 60 MG/2ML IM SOLN: 60.0000 mg | Freq: Once | INTRAMUSCULAR | Status: AC | PRN

## 2012-05-16 MED ORDER — MEPERIDINE HCL 25 MG/ML IJ SOLN: 6.2500 mg | INTRAMUSCULAR | Status: DC | PRN

## 2012-05-16 MED ORDER — NALBUPHINE HCL 10 MG/ML IJ SOLN
5.0000 mg | INTRAMUSCULAR | Status: DC | PRN
  Filled 2012-05-16: qty 1

## 2012-05-16 MED ORDER — SIMETHICONE 80 MG PO CHEW
80.0000 mg | CHEWABLE_TABLET | ORAL | Status: DC | PRN
  Administered 2012-05-16: 80 mg via ORAL

## 2012-05-16 MED ORDER — PRENATAL MULTIVITAMIN CH: 1.0000 | ORAL_TABLET | Freq: Every day | ORAL | Status: DC

## 2012-05-16 MED ORDER — MEASLES, MUMPS & RUBELLA VAC ~~LOC~~ INJ
0.5000 mL | INJECTION | Freq: Once | SUBCUTANEOUS | Status: DC
  Filled 2012-05-16: qty 0.5

## 2012-05-16 MED ORDER — OXYTOCIN 10 UNIT/ML IJ SOLN
INTRAMUSCULAR | Status: AC
  Filled 2012-05-16: qty 4

## 2012-05-16 MED ORDER — CEFAZOLIN SODIUM-DEXTROSE 2-3 GM-% IV SOLR
2.0000 g | Freq: Once | INTRAVENOUS | Status: AC
  Administered 2012-05-16: 2 g via INTRAVENOUS
  Filled 2012-05-16: qty 50

## 2012-05-16 MED ORDER — DIPHENHYDRAMINE HCL 25 MG PO CAPS
25.0000 mg | ORAL_CAPSULE | ORAL | Status: DC | PRN
  Administered 2012-05-17: 25 mg via ORAL
  Filled 2012-05-16 (×2): qty 1

## 2012-05-16 MED ORDER — OXYTOCIN 10 UNIT/ML IJ SOLN
40.0000 [IU] | INTRAVENOUS | Status: DC | PRN
  Administered 2012-05-16: 40 [IU] via INTRAVENOUS

## 2012-05-16 MED ORDER — KETOROLAC TROMETHAMINE 30 MG/ML IJ SOLN: 30.0000 mg | Freq: Four times a day (QID) | INTRAMUSCULAR | Status: AC | PRN

## 2012-05-16 MED ORDER — OXYCODONE-ACETAMINOPHEN 5-325 MG PO TABS
1.0000 | ORAL_TABLET | ORAL | Status: DC | PRN
  Administered 2012-05-17: 1 via ORAL
  Administered 2012-05-17 (×2): 2 via ORAL
  Filled 2012-05-16 (×3): qty 2
  Filled 2012-05-16: qty 1

## 2012-05-16 MED ORDER — NALOXONE HCL 1 MG/ML IJ SOLN
1.0000 ug/kg/h | INTRAVENOUS | Status: DC | PRN
  Filled 2012-05-16: qty 2

## 2012-05-16 MED ORDER — DIBUCAINE 1 % RE OINT: 1.0000 "application " | TOPICAL_OINTMENT | RECTAL | Status: DC | PRN

## 2012-05-16 MED ORDER — HYDROMORPHONE HCL PF 1 MG/ML IJ SOLN
1.0000 mg | Freq: Once | INTRAMUSCULAR | Status: AC
  Administered 2012-05-16: 1 mg via INTRAVENOUS
  Filled 2012-05-16: qty 1

## 2012-05-16 MED ORDER — METOCLOPRAMIDE HCL 5 MG/ML IJ SOLN: 10.0000 mg | Freq: Three times a day (TID) | INTRAMUSCULAR | Status: DC | PRN

## 2012-05-16 MED ORDER — ONDANSETRON HCL 4 MG/2ML IJ SOLN: 4.0000 mg | Freq: Three times a day (TID) | INTRAMUSCULAR | Status: DC | PRN

## 2012-05-16 MED ORDER — SCOPOLAMINE 1 MG/3DAYS TD PT72: 1.0000 | MEDICATED_PATCH | Freq: Once | TRANSDERMAL | Status: DC

## 2012-05-16 MED ORDER — FENTANYL CITRATE 0.05 MG/ML IJ SOLN
INTRAMUSCULAR | Status: DC | PRN
  Administered 2012-05-16: 12.5 ug via INTRAVENOUS
  Administered 2012-05-16: 12.5 ug via INTRATHECAL
  Administered 2012-05-16: 25 ug via INTRAVENOUS

## 2012-05-16 MED ORDER — PROMETHAZINE HCL 25 MG/ML IJ SOLN: 6.2500 mg | INTRAMUSCULAR | Status: DC | PRN

## 2012-05-16 MED ORDER — OXYTOCIN 40 UNITS IN LACTATED RINGERS INFUSION - SIMPLE MED: 62.5000 mL/h | INTRAVENOUS | Status: DC

## 2012-05-16 MED ORDER — DIPHENHYDRAMINE HCL 50 MG/ML IJ SOLN: 12.5000 mg | INTRAMUSCULAR | Status: DC | PRN

## 2012-05-16 MED ORDER — BUPIVACAINE IN DEXTROSE 0.75-8.25 % IT SOLN
INTRATHECAL | Status: DC | PRN
  Administered 2012-05-16: 1.6 mL via INTRATHECAL

## 2012-05-16 MED ORDER — KETOROLAC TROMETHAMINE 60 MG/2ML IM SOLN
INTRAMUSCULAR | Status: AC
  Administered 2012-05-16: 60 mg via INTRAMUSCULAR
  Filled 2012-05-16: qty 2

## 2012-05-16 MED ORDER — NALOXONE HCL 0.4 MG/ML IJ SOLN: 0.4000 mg | INTRAMUSCULAR | Status: DC | PRN

## 2012-05-16 MED ORDER — DIPHENHYDRAMINE HCL 50 MG/ML IJ SOLN: 25.0000 mg | INTRAMUSCULAR | Status: DC | PRN

## 2012-05-16 MED ORDER — WITCH HAZEL-GLYCERIN EX PADS: 1.0000 "application " | MEDICATED_PAD | CUTANEOUS | Status: DC | PRN

## 2012-05-16 MED ORDER — ONDANSETRON HCL 4 MG/2ML IJ SOLN: 4.0000 mg | INTRAMUSCULAR | Status: DC | PRN

## 2012-05-16 MED ORDER — PHENYLEPHRINE 40 MCG/ML (10ML) SYRINGE FOR IV PUSH (FOR BLOOD PRESSURE SUPPORT)
PREFILLED_SYRINGE | INTRAVENOUS | Status: AC
  Filled 2012-05-16: qty 5

## 2012-05-16 MED ORDER — EPHEDRINE SULFATE 50 MG/ML IJ SOLN
INTRAMUSCULAR | Status: DC | PRN
  Administered 2012-05-16: 10 mg via INTRAVENOUS

## 2012-05-16 MED ORDER — HYDROMORPHONE HCL PF 1 MG/ML IJ SOLN: 0.2500 mg | INTRAMUSCULAR | Status: DC | PRN

## 2012-05-16 SURGICAL SUPPLY — 37 items
APL SKNCLS STERI-STRIP NONHPOA (GAUZE/BANDAGES/DRESSINGS) ×1
BENZOIN TINCTURE PRP APPL 2/3 (GAUZE/BANDAGES/DRESSINGS) ×1 IMPLANT
CLOTH BEACON ORANGE TIMEOUT ST (SAFETY) ×2 IMPLANT
CONTAINER PREFILL 10% NBF 15ML (MISCELLANEOUS) IMPLANT
DRAIN JACKSON PRT FLT 7MM (DRAIN) IMPLANT
DRAPE LG THREE QUARTER DISP (DRAPES) ×2 IMPLANT
DRSG OPSITE POSTOP 4X10 (GAUZE/BANDAGES/DRESSINGS) ×2 IMPLANT
DURAPREP 26ML APPLICATOR (WOUND CARE) ×2 IMPLANT
ELECT REM PT RETURN 9FT ADLT (ELECTROSURGICAL) ×2
ELECTRODE REM PT RTRN 9FT ADLT (ELECTROSURGICAL) ×1 IMPLANT
EVACUATOR SILICONE 100CC (DRAIN) IMPLANT
EXTRACTOR VACUUM M CUP 4 TUBE (SUCTIONS) IMPLANT
GLOVE BIO SURGEON STRL SZ7 (GLOVE) ×2 IMPLANT
GLOVE BIOGEL PI IND STRL 7.0 (GLOVE) ×1 IMPLANT
GLOVE BIOGEL PI INDICATOR 7.0 (GLOVE) ×4
GOWN STRL REIN XL XLG (GOWN DISPOSABLE) ×5 IMPLANT
KIT ABG SYR 3ML LUER SLIP (SYRINGE) ×1 IMPLANT
NDL HYPO 25X5/8 SAFETYGLIDE (NEEDLE) ×1 IMPLANT
NEEDLE HYPO 25X5/8 SAFETYGLIDE (NEEDLE) ×2 IMPLANT
NS IRRIG 1000ML POUR BTL (IV SOLUTION) ×2 IMPLANT
PACK C SECTION WH (CUSTOM PROCEDURE TRAY) ×2 IMPLANT
PAD ABD 7.5X8 STRL (GAUZE/BANDAGES/DRESSINGS) ×1 IMPLANT
PAD OB MATERNITY 4.3X12.25 (PERSONAL CARE ITEMS) ×2 IMPLANT
RTRCTR C-SECT PINK 25CM LRG (MISCELLANEOUS) ×2 IMPLANT
SLEEVE SCD COMPRESS KNEE MED (MISCELLANEOUS) IMPLANT
STAPLER VISISTAT 35W (STAPLE) IMPLANT
STRIP CLOSURE SKIN 1/2X4 (GAUZE/BANDAGES/DRESSINGS) ×1 IMPLANT
SUT MON AB 2-0 CT1 27 (SUTURE) ×1 IMPLANT
SUT VIC AB 0 CTX 36 (SUTURE) ×8
SUT VIC AB 0 CTX36XBRD ANBCTRL (SUTURE) ×5 IMPLANT
SUT VIC AB 2-0 CT1 27 (SUTURE) ×2
SUT VIC AB 2-0 CT1 TAPERPNT 27 (SUTURE) IMPLANT
SUT VIC AB 4-0 KS 27 (SUTURE) ×1 IMPLANT
TAPE CLOTH SURG 4X10 WHT LF (GAUZE/BANDAGES/DRESSINGS) ×1 IMPLANT
TOWEL OR 17X24 6PK STRL BLUE (TOWEL DISPOSABLE) ×6 IMPLANT
TRAY FOLEY CATH 14FR (SET/KITS/TRAYS/PACK) ×2 IMPLANT
WATER STERILE IRR 1000ML POUR (IV SOLUTION) ×2 IMPLANT

## 2012-05-16 NOTE — Anesthesia Procedure Notes (Signed)
Spinal  Patient location during procedure: OR Start time: 05/16/2012 11:13 AM End time: 05/16/2012 11:16 AM Staffing Anesthesiologist: Sandrea Hughs Performed by: anesthesiologist  Preanesthetic Checklist Completed: patient identified, site marked, surgical consent, pre-op evaluation, timeout performed, IV checked, risks and benefits discussed and monitors and equipment checked Spinal Block Patient position: sitting Prep: DuraPrep Patient monitoring: heart rate, cardiac monitor, continuous pulse ox and blood pressure Approach: midline Location: L3-4 Injection technique: single-shot Needle Needle type: Sprotte  Needle gauge: 24 G Needle length: 9 cm Needle insertion depth: 7 cm Assessment Sensory level: T4

## 2012-05-16 NOTE — Op Note (Signed)
[name] PROCEDURE DATE: 05/14/2012 - 05/16/2012  PREOPERATIVE DIAGNOSIS: Intrauterine pregnancy at  [redacted]w[redacted]d weeks gestation in breech presentation and severe preeclampsia.  POSTOPERATIVE DIAGNOSIS: The same  PROCEDURE:    Low Transverse Cesarean Section  SURGEON:  Dr. Elsie Lincoln  ASSISTANT: Dr. Napoleon Form  INDICATIONS: [name] is a 26 y.o. [phone] at [redacted]w[redacted]d in breech presentation and severe preeclampsia.  The risks of cesarean section discussed with the patient included but were not limited to: bleeding which may require transfusion or reoperation; infection which may require antibiotics; injury to bowel, bladder, ureters or other surrounding organs; injury to the fetus; need for additional procedures including hysterectomy in the event of a life-threatening hemorrhage; placental abnormalities wth subsequent pregnancies, incisional problems, thromboembolic phenomenon and other postoperative/anesthesia complications. The patient concurred with the proposed plan, giving informed written consent for the procedure.    FINDINGS:  Viable female infant in frank breech presentation,  clear amniotic fluid.  Intact placenta, three vessel cord.  Grossly normal uterus, ovaries and fallopian tubes. .   ANESTHESIA:    Spinal ESTIMATED BLOOD LOSS: 1000 ml SPECIMENS: Placenta sent to Pathology COMPLICATIONS: None immediate  PROCEDURE IN DETAIL:  The patient received intravenous antibiotics and had sequential compression devices applied to her lower extremities while in the preoperative area.  She was then taken to the operating room where spinal anesthesia was administered and was found to be adequate. She was then placed in a dorsal supine position with a leftward tilt, and prepped and draped in a sterile manner.  A foley catheter was placed into her bladder and attached to constant gravity.  After an adequate timeout was performed, a Pfannenstiel skin incision was made with scalpel and carried  through to the underlying layer of fascia. The fascia was incised in the midline and this incision was extended bilaterally using the Mayo scissors. Kocher clamps were applied to the superior aspect of the fascial incision and the underlying rectus muscles were dissected off bluntly. A similar process was carried out on the inferior aspect of the facial incision. The rectus muscles were separated in the midline bluntly and the peritoneum was entered bluntly.   A transverse hysterotomy was made with a scalpel and extended bilaterally bluntly. The bladder blade was then removed. The infant was successfully delivered, and cord was clamped and cut and infant was handed over to awaiting neonatology team. Uterine massage was then administered and the placenta delivered intact with three-vessel cord. The uterus was cleared of clot and debris.  The hysterotomy was closed with 0 vicryl.  A second imbricating suture of 0-Vicryl was used to reinforce the incision and aid in hemostasis.  The peritoneum and rectus muscles were noted to be hemostatic.  The fascia was closed with 0-Vicryl in a running fashion with good restoration of anatomy.  The subcutaneus tissue was copiously irrigated.  The skin was closed with 4-0 Vicryl in a subcuticular fashion.  Pt tolerated the procedure will.  All counts were correct x2.  Pt went to the recovery room in stable condition.

## 2012-05-16 NOTE — Transfer of Care (Signed)
Immediate Anesthesia Transfer of Care Note  Patient: [name]  Procedure(s) Performed: Procedure(s): Primary cesarean section with delivery of baby girl at 27. Apgars 8/9. (N/A)  Patient Location: PACU  Anesthesia Type:Spinal  Level of Consciousness: awake, alert  and oriented  Airway & Oxygen Therapy: Patient Spontanous Breathing  Post-op Assessment: Report given to PACU RN and Post -op Vital signs reviewed and stable  Post vital signs: Reviewed and stable  Complications: No apparent anesthesia complications

## 2012-05-16 NOTE — Anesthesia Postprocedure Evaluation (Signed)
Anesthesia Post Note  Patient: [name]  Procedure(s) Performed: Procedure(s) (LRB): Primary cesarean section with delivery of baby girl at 26. Apgars 8/9. (N/A)  Anesthesia type: Spinal  Patient location: PACU  Post pain: Pain level controlled  Post assessment: Post-op Vital signs reviewed  Last Vitals:  Filed Vitals:   05/16/12 1345  BP: 135/71  Pulse: 72  Temp: 36.5 C  Resp: 19    Post vital signs: Reviewed  Level of consciousness: awake  Complications: No apparent anesthesia complications

## 2012-05-16 NOTE — Anesthesia Preprocedure Evaluation (Signed)
Anesthesia Evaluation  Patient identified by MRN, date of birth, ID band Patient awake    Reviewed: Allergy & Precautions, H&P , NPO status , Patient's Chart, lab work & pertinent test results  Airway Mallampati: II TM Distance: >3 FB Neck ROM: full    Dental no notable dental hx.    Pulmonary neg pulmonary ROS,    Pulmonary exam normal       Cardiovascular hypertension, Pt. on medications     Neuro/Psych negative neurological ROS  negative psych ROS   GI/Hepatic negative GI ROS, Neg liver ROS,   Endo/Other  Morbid obesity  Renal/GU negative Renal ROS  negative genitourinary   Musculoskeletal negative musculoskeletal ROS (+)   Abdominal   Peds negative pediatric ROS (+)  Hematology negative hematology ROS (+)   Anesthesia Other Findings   Reproductive/Obstetrics (+) Pregnancy                           Anesthesia Physical Anesthesia Plan  ASA: III  Anesthesia Plan: Spinal   Post-op Pain Management:    Induction:   Airway Management Planned:   Additional Equipment:   Intra-op Plan:   Post-operative Plan:   Informed Consent: I have reviewed the patients History and Physical, chart, labs and discussed the procedure including the risks, benefits and alternatives for the proposed anesthesia with the patient or authorized representative who has indicated his/her understanding and acceptance.     Plan Discussed with: CRNA and Surgeon  Anesthesia Plan Comments:         Anesthesia Quick Evaluation

## 2012-05-16 NOTE — Consult Note (Signed)
Asked by Dr Macon Large to speak to [name] to discuss preterm outcome. I reviewed this mo's chart. Briefly, she is at 35 2/[redacted] wks gestation with preeclampsia. Growth of the baby is appropriate based on last FUS, at 50%. She may need to be delivered due toincreasing symptoms. Magnesium sulfate to be started this a.m.  I spoke to [name] and her spouse in her room. I discussed different scenerios of outcome of 35 wk preterms with the infant going to NICU vs central nursery depending on pulmonary development. Discussed details of monitoring feeding ability and maintenance of temp. [name] stated she is very familiar with this as she had 2 preterm babies, one 3 mos early, the second a month early. I reassured her that the Neo present at delivery will assess the infant's condition and will inform her and FOB.  Thank you for letting us participate in [name]'s care.  Chantell Kunkler Q  Total time: 20 min.

## 2012-05-16 NOTE — Progress Notes (Signed)
Faculty Practice OB/GYN Attending Note  Subjective:  Patient had headache yesterday that resolved, reports mild headache now.  Had BP 162/89 around 2018 on 05/15/12. 24 hour urine analysis pending.  Denies any chest pain, blurry vision, RUQ/epigastric pain  or other symptoms.  FHR reassuring, no contractions, no LOF or vaginal bleeding. Good FM.  Admitted on 05/14/2012 for Preeclampsia. Current GA [redacted]w[redacted]d.   Objective:  Blood pressure 141/83, pulse 80, temperature 98.3 F (36.8 C), temperature source Oral, resp. rate 18, height 5\' 4"  (1.626 m), weight 248 lb 9.6 oz (112.764 kg), last menstrual period 09/12/2011, SpO2 98.00%. Temp:  [98.1 F (36.7 C)-98.6 F (37 C)] 98.3 F (36.8 C) (03/30 0759) Pulse Rate:  [69-85] 80 (03/29 2243) Resp:  [18-20] 18 (03/30 0759) BP: (141-162)/(83-93) 141/83 mmHg (03/29 2243) SpO2:  [98 %] 98 % (03/29 1303) Weight:  [248 lb 9.6 oz (112.764 kg)] 248 lb 9.6 oz (112.764 kg) (03/29 1013) FHT  Baseline 130 bpm, moderate variability, +accelerations, no decelerations Toco: none Gen: NAD Lungs: CTAB, no evidence of pulmonary edema Abdomen: NT gravid fundus, soft Cervix: Deferred Ext: 2+ DTRs,  3+ symmetric BLE, no cyanosis, negative Homan's sign  Assessment & Plan:  27 y.o. [id number] at [redacted]w[redacted]d admitted for preeclampsia, now with evidence of severe preeclampsia by BP criteria and headache. Will start magnesium sulfate now; antihypertensives as needed Will proceed with delivery today; patient is breech and will need a cesarean section.  The risks of cesarean section discussed with the patient included but were not limited to: bleeding which may require transfusion or reoperation; infection which may require antibiotics; injury to bowel, bladder, ureters or other surrounding organs; injury to the fetus; need for additional procedures including hysterectomy in the event of a life-threatening hemorrhage; placental abnormalities wth subsequent pregnancies, incisional  problems, thromboembolic phenomenon and other postoperative/anesthesia complications. The patient concurred with the proposed plan, giving informed written consent for the procedure.   Patient has been NPO since 0100 for food and 0600 for apple juice she will remain NPO for procedure. Anesthesia, NICU and OR aware. Preoperative prophylactic antibiotics and SCDs ordered on call to the OR.  To OR when ready.   Jaynie Collins, MD, FACOG Attending Obstetrician & Gynecologist Faculty Practice, [facility] [place]

## 2012-05-16 NOTE — OR Nursing (Addendum)
Uterus massaged by S. Samba Cumba RN.  Two tubes of cord blood sent to lab.  10cc of blood evacuated from uterus during uterine massage. 

## 2012-05-17 ENCOUNTER — Encounter (HOSPITAL_COMMUNITY): Payer: Self-pay | Admitting: Obstetrics & Gynecology

## 2012-05-17 ENCOUNTER — Encounter: Payer: Medicaid Other | Admitting: Obstetrics and Gynecology

## 2012-05-17 ENCOUNTER — Telehealth: Payer: Self-pay | Admitting: *Deleted

## 2012-05-17 LAB — CBC
HCT: 28.4 % — ABNORMAL LOW (ref 36.0–46.0)
MCH: 28.4 pg (ref 26.0–34.0)
MCHC: 32.7 g/dL (ref 30.0–36.0)
MCV: 86.6 fL (ref 78.0–100.0)
RDW: 13.5 % (ref 11.5–15.5)

## 2012-05-17 NOTE — Progress Notes (Signed)
UR chart review completed.  

## 2012-05-17 NOTE — Telephone Encounter (Signed)
Pt. States delivered Saturday. Pt congratulated. Dr. Erma Pinto informed

## 2012-05-17 NOTE — Progress Notes (Signed)
Subjective: Postpartum Day 1: Cesarean Delivery Patient reports incisional pain, tolerating PO and + flatus.    Objective: Vital signs in last 24 hours: Temp:  [97.3 F (36.3 C)-98.3 F (36.8 C)] 98.1 F (36.7 C) (03/31 0808) Pulse Rate:  [61-106] 73 (03/31 0808) Resp:  [16-24] 18 (03/31 0808) BP: (110-159)/(58-97) 125/59 mmHg (03/31 0808) SpO2:  [97 %-100 %] 97 % (03/31 0808) Weight:  [109.634 kg (241 lb 11.2 oz)-113.581 kg (250 lb 6.4 oz)] 113.581 kg (250 lb 6.4 oz) (03/31 4540)  Physical Exam:  General: alert, cooperative and no distress Lochia: appropriate Uterine Fundus: firm Incision: no significant drainage, no significant erythema DVT Evaluation: No evidence of DVT seen on physical exam. Negative Homan's sign. No cords or calf tenderness. Neuro: 3+ DTRs, 2 beats of clonus on BL LE   Recent Labs  05/16/12 0643 05/17/12 0630  HGB 11.5* 9.3*  HCT 34.9* 28.4*    Intake/Output Summary (Last 24 hours) at 05/17/12 0856 Last data filed at 05/17/12 0800  Gross per 24 hour  Intake 5461.25 ml  Output   2700 ml  Net 2761.25 ml     Assessment/Plan: Status post Cesarean section. Postoperative course complicated by Pre-eclampsia, will complete 24 hours of magnesium around 11 am.   Positive fluid balance with 850 mL of UOP since 1900 last night, will leave foley for now to closely monitor UOP.  No headache or scotoma, Blood pressures normalizing Continue current care.  Kevin Fenton 05/17/2012, 8:55 AM

## 2012-05-17 NOTE — Anesthesia Postprocedure Evaluation (Signed)
  Anesthesia Post-op Note  Patient: [name]  Procedure(s) Performed: Procedure(s): Primary cesarean section with delivery of baby girl at 61. Apgars 8/9. (N/A)  Patient Location: PACU and Mother/Baby  Anesthesia Type:Spinal  Level of Consciousness: awake, alert  and oriented  Airway and Oxygen Therapy: Patient Spontanous Breathing  Post-op Pain: mild  Post-op Assessment: Patient's Cardiovascular Status Stable, Respiratory Function Stable, No signs of Nausea or vomiting, Adequate PO intake and Pain level controlled  Post-op Vital Signs: stable  Complications: No apparent anesthesia complications

## 2012-05-18 MED ORDER — IBUPROFEN 600 MG PO TABS: 600.0000 mg | ORAL_TABLET | Freq: Four times a day (QID) | ORAL | Status: DC

## 2012-05-18 MED ORDER — OXYCODONE-ACETAMINOPHEN 5-325 MG PO TABS: 1.0000 | ORAL_TABLET | ORAL | Status: DC | PRN

## 2012-05-18 NOTE — Discharge Summary (Signed)
Obstetric Discharge Summary Reason for Admission: swelling and breathing issues, hypertension Also noted to be breech Prenatal Procedures: NST Intrapartum Procedures: cesarean: low cervical, transverse Postpartum Procedures: none Complications-Operative and Postpartum: none  Desires early discharge today (Post OP Day 2)  Hemoglobin  Date Value Range Status  05/17/2012 9.3* 12.0 - 15.0 g/dL Final     DELTA CHECK NOTED     REPEATED TO VERIFY     HCT  Date Value Range Status  05/17/2012 28.4* 36.0 - 46.0 % Final  Hospital Course: Pt is a 27 y/o [id number] at [redacted]w[redacted]d with hx of swelling and trace protein presenting with increased edema and weight gain  She later presented with  evidence of severe preeclampsia by BP criteria and headache.  Will start magnesium sulfate now; antihypertensives as needed  Will proceed with delivery today; patient is breech and will need a cesarean section.  PREOPERATIVE DIAGNOSIS: Intrauterine pregnancy at [redacted]w[redacted]d weeks gestation in breech presentation and severe preeclampsia.  POSTOPERATIVE DIAGNOSIS: The same  PROCEDURE: Low Transverse Cesarean Section  SURGEON: Dr. Elsie Lincoln  ASSISTANT: Dr. Napoleon Form  INDICATIONS: [name] is a 27 y.o. [phone] at [redacted]w[redacted]d in breech presentation and severe preeclampsia. The risks of cesarean section discussed with the patient included but were not limited to: bleeding which may require transfusion or reoperation; infection which may require antibiotics; injury to bowel, bladder, ureters or other surrounding organs; injury to the fetus; need for additional procedures including hysterectomy in the event of a life-threatening hemorrhage; placental abnormalities wth subsequent pregnancies, incisional problems, thromboembolic phenomenon and other postoperative/anesthesia complications. The patient concurred with the proposed plan, giving informed written consent for the procedure.  FINDINGS: Viable female infant in frank breech  presentation,  clear amniotic fluid. Intact placenta, three vessel cord. Grossly normal uterus, ovaries and fallopian tubes.  .  ANESTHESIA: Spinal  ESTIMATED BLOOD LOSS: 1000 ml  SPECIMENS: Placenta sent to Pathology  COMPLICATIONS: None immediate  She did well postop and requested early discharge on POD#2.   Physical Exam:  General: alert, cooperative and no distress Lochia: appropriate Uterine Fundus: firm Incision: no significant drainage, Dressing still on, needs to be removed DVT Evaluation: No evidence of DVT seen on physical exam.  Discharge Diagnoses: Term Pregnancy-delivered and Preelampsia  Discharge Information: Date: 05/18/2012 Activity: unrestricted and pelvic rest Diet: routine Medications: PNV, Ibuprofen and Percocet Condition: stable Instructions: refer to practice specific booklet Discharge to: home Follow-up Information   Follow up with Tilda Burrow, MD. Schedule an appointment as soon as possible for a visit in 2 weeks.   Contact information:   [address] [place] [address] [phone]       Newborn Data: Live born female  Birth Weight: 4 lb 13.4 oz (2195 g) APGAR: 8, 9  Home with mother.  [facility] 05/18/2012, 7:56 AM

## 2012-05-19 NOTE — Progress Notes (Signed)
Pt seen and examined and agree with above. 

## 2012-05-21 ENCOUNTER — Encounter: Payer: Self-pay | Admitting: Obstetrics & Gynecology

## 2012-05-21 ENCOUNTER — Ambulatory Visit (INDEPENDENT_AMBULATORY_CARE_PROVIDER_SITE_OTHER): Payer: Medicaid Other | Admitting: Obstetrics & Gynecology

## 2012-05-21 ENCOUNTER — Telehealth: Payer: Self-pay | Admitting: Obstetrics and Gynecology

## 2012-05-21 VITALS — BP 144/68 | Ht 64.0 in | Wt 237.0 lb

## 2012-05-21 DIAGNOSIS — Z9889 Other specified postprocedural states: Secondary | ICD-10-CM

## 2012-05-21 MED ORDER — IBUPROFEN 800 MG PO TABS: 800.0000 mg | ORAL_TABLET | Freq: Three times a day (TID) | ORAL | Status: DC | PRN

## 2012-05-21 MED ORDER — TRIAMTERENE-HCTZ 50-25 MG PO CAPS: 1.0000 | ORAL_CAPSULE | ORAL | Status: DC

## 2012-05-21 NOTE — Progress Notes (Signed)
Patient ID: [name], female   DOB: March 06, 1985, 27 y.o.   MRN: [id number]  HPI: Patient returns for routine postoperative follow-up having undergone Caesarean section on 3.30.2014. The patient's early postoperative recovery while in the hospital was notable for unremarkable. Since hospital discharge the patient reports no problems.   Current Outpatient Prescriptions  Medication Sig Dispense Refill  . folic acid (FOLVITE) 800 MCG tablet Take 400 mcg by mouth daily.      Marland Kitchen oxyCODONE-acetaminophen (PERCOCET/ROXICET) 5-325 MG per tablet Take 1-2 tablets by mouth every 4 (four) hours as needed.  50 tablet  0  . Prenatal Vit-Fe Fumarate-FA (PRENATAL S PO) Take 1 tablet by mouth daily.       Marland Kitchen acetaminophen (TYLENOL) 500 MG tablet Take 500 mg by mouth every 6 (six) hours as needed for pain.      . calcium carbonate (TUMS - DOSED IN MG ELEMENTAL CALCIUM) 500 MG chewable tablet Chew 1-2 tablets by mouth daily as needed.       . hydroxyprogesterone caproate (DELALUTIN) 250 mg/mL OIL Inject 250 mg into the muscle once a week.      Marland Kitchen ibuprofen (ADVIL,MOTRIN) 600 MG tablet Take 1 tablet (600 mg total) by mouth every 6 (six) hours.  30 tablet  1  . ibuprofen (ADVIL,MOTRIN) 800 MG tablet Take 1 tablet (800 mg total) by mouth every 8 (eight) hours as needed for pain.  60 tablet  1  . triamterene-hydrochlorothiazide (DYAZIDE) 50-25 MG per capsule Take 1 capsule by mouth every morning.  30 capsule  0  . valACYclovir (VALTREX) 500 MG tablet Take 500 mg by mouth as needed.        No current facility-administered medications for this visit.    Physical Exam: Incision clean dry intact GV placed  Diagnostic Tests: None  Impression: S/p C section for pre eclampsia  Plan: Follow up post partum appt in 4 weeks Dyazide 50/25 daily for swelling Ibuprofen 800 for pain

## 2012-05-21 NOTE — Patient Instructions (Signed)
Cesarean Delivery  Care After  Refer to this sheet in the next few weeks. These instructions provide you with information on caring for yourself after your procedure. Your caregiver may also give you more specific instructions. Your treatment has been planned according to current medical practices, but problems sometimes occur. Call your caregiver if you have any problems or questions after your procedure.  HOME CARE INSTRUCTIONS  Healing will take time. You will have discomfort, tenderness, swelling, and bruising at the surgery site for a couple of weeks. This is normal and will get better as time goes on.  Activity  · Rest as much as possible the first 2 weeks.  · When possible, have someone help you with your household activities and your baby for 2 to 3 weeks.  · Limit your housework and social activity. Increase your activity gradually as your strength returns.  · Do not climb stairs more than 2 to 3 times a day.  · Do not lift anything heavier than your baby.  · Follow your caregiver's instructions about driving a car.  · Exercise only as directed by your caregiver.  Nutrition  · You may return to your usual diet. Eat a well-balanced diet.  · Drink enough fluid to keep your urine clear or pale yellow.  · Keep taking your prenatal or multivitamins.  · Do not drink alcohol until your caregiver says it is okay.  Elimination  You should return to your usual bowel function. If you develop constipation, ask your caregiver about taking a mild laxative that will help you go to the bathroom. Bran foods and fluids help with constipation. Gradually add fruit, vegetables, and bran to your diet.   Hygiene  · You may shower, wash your hair, and take tub baths unless your caregiver tells you otherwise.  · Continue perineal care until your vaginal bleeding and discharge stops.  · Do not douche or use tampons until your caregiver says it is okay.  Fever  If you feel feverish or have shaking chills, take your temperature. The  fever may indicate infection. Infections can be treated with antibiotic medicine.  Pain Control and Medicine  · Only take over-the-counter or prescription medicine as directed by your caregiver. Do not take aspirin. It can cause bleeding.  · Do not drive when taking pain medicine.  · Talk to your caregiver about restarting or adjusting your normal medicines.  Incision Care  · Clean your cut (incision) gently with soap and water, then pat dry.  · If your caregiver says it is okay, leave the incision without a bandage (dressing) unless it is draining fluid or irritated.  · If you have small adhesive strips across the incision and they do not fall off within 7 days, carefully peel them off.  · Check the incision daily for increased redness, drainage, swelling, or separation of skin.  · Hug a pillow when coughing or sneezing. This helps to relieve pain.  Vaginal Care  You may have a vaginal discharge or bleeding for up to 6 weeks. If the vaginal discharge becomes bright red, bad smelling, heavy in amount, has blood clots, or if you have burning or frequent urination, call your caregiver. If your bleeding slows down and then gets heavier, your body is telling you to slow down and relax more.  Sexual Intercourse  · Check with your caregiver before resuming sexual activity. Often, after 4 to 6 weeks, if you feel good and are well rested, sexual activity may be resumed. Avoid   positions that strain the incision site.  · You can become pregnant before you have a period. If you decide to have sexual intercourse, use birth control if you do not want to become pregnant right away.  Health Practices  · Keep all your postpartum appointments as recommended by your caregiver. Generally, your caregiver will want to see you in 2 to 3 weeks.  · Continue with your yearly pelvic exams.  · Continue monthly self-breast exams and yearly physical exams with a Pap test.  Breast Care  · If you are not breastfeeding and your breasts become  tender, hard, or leak milk, you may wear a tight-fitting bra and apply ice to your breasts.  · If you are breastfeeding, wear a good support bra.  · Call your caregiver if you have breast pain, flu-like symptoms, fever, or hardness and reddening of your breasts.  Postpartum Blues  You may have a period of low spirits or "blues" after your baby is born. Discuss your feelings with your partner, family, and friends. This may be caused by the changing hormone levels in your body. You may want to contact your caregiver if this is worrisome.  Miscellaneous  · Limit wearing support panties or control-top hose.  · If you breastfeed, you may not have a period for several months or longer. This is normal for the nursing mother. If you do not menstruate within 6 weeks after you stop breastfeeding, see your caregiver.  · If you are not breastfeeding, you can expect to menstruate within 6 to 10 weeks after birth. If you have not started by the 11th week, check with your caregiver.  SEEK MEDICAL CARE IF:   · There is swelling, redness, or increasing pain in the wound area.  · You have pus coming from the wound.  · You notice a bad smell from the wound or surgical dressing.  · You have pain, redness, and swelling from the intravenous (IV) site.  · Your wound breaks open (the edges are not staying together).  · You feel dizzy or feel like fainting.  · You develop pain or bleeding when you urinate.  · You develop diarrhea.  · You develop nausea and vomiting.  · You develop abnormal vaginal discharge.  · You develop a rash.  · You have any type of abnormal reaction or develop an allergy to your medicine.  · Your pain is not relieved by your medicine or becomes worse.  · Your temperature is 101° F (38.3° C), or is 100.4° F (38° C) taken 2 times in a 4 hour period.  SEEK IMMEDIATE MEDICAL CARE:  · You develop a temperature of 102° F (38.9° C) or higher.  · You develop abdominal pain.  · You develop chest pain.  · You develop shortness  of breath.  · You faint.  · You develop pain, swelling, or redness of your leg.  · You develop heavy vaginal bleeding with or without blood clots.  Document Released: 10/26/2001 Document Revised: 04/28/2011 Document Reviewed: 05/01/2010  Exit[employer]® Patient Information ©2013 [employer].

## 2012-05-21 NOTE — Telephone Encounter (Signed)
Pt told to come to office now to be evaluated for headaches and swelling since c-section.

## 2012-05-24 ENCOUNTER — Telehealth: Payer: Self-pay | Admitting: *Deleted

## 2012-05-24 NOTE — Telephone Encounter (Signed)
Spoke with pt. Dr. Despina Hidden prescribed med Friday for fluid. Was told by [facility] that Dr. Despina Hidden would need to prescribe something else. Pt was unsure why. Pt still has fluid. Can you order fluid pill? [employer].

## 2012-05-24 NOTE — Telephone Encounter (Signed)
Left message. JSY 

## 2012-05-24 NOTE — Telephone Encounter (Signed)
I talked with patient and told her that a change in dosage was required, they didn't have the other in stock.  So now 37.5/25 is the dosing for the med.

## 2012-05-26 ENCOUNTER — Ambulatory Visit: Payer: Medicaid Other | Admitting: Obstetrics and Gynecology

## 2012-06-17 ENCOUNTER — Encounter: Payer: Self-pay | Admitting: *Deleted

## 2012-06-17 DIAGNOSIS — Z87898 Personal history of other specified conditions: Secondary | ICD-10-CM | POA: Insufficient documentation

## 2012-06-18 ENCOUNTER — Ambulatory Visit (INDEPENDENT_AMBULATORY_CARE_PROVIDER_SITE_OTHER): Payer: Medicaid Other | Admitting: Obstetrics & Gynecology

## 2012-06-18 ENCOUNTER — Encounter: Payer: Self-pay | Admitting: Obstetrics & Gynecology

## 2012-06-18 MED ORDER — FEXOFENADINE HCL 180 MG PO TABS: 180.0000 mg | ORAL_TABLET | Freq: Every day | ORAL | Status: DC

## 2012-06-18 NOTE — Patient Instructions (Signed)

## 2012-06-18 NOTE — Progress Notes (Signed)
Patient ID: [name], female   DOB: 08/12/1985, 27 y.o.   MRN: [id number] Subjective:     [name] is a 27 y.o. female who presents for a postpartum visit. She is 5 weeks postpartum following a low cervical transverse Cesarean section. I have fully reviewed the prenatal and intrapartum course. The delivery was at 35 gestational weeks. Outcome: repeat cesarean section, low transverse incision. Anesthesia: spinal. Postpartum course has been unremarkable. Baby's course has been unremarkable. Baby is feeding by bottle - Enfacare. Bleeding no bleeding. Bowel function is abnormal: resolving diarrhea. Bladder function is normal. Patient is sexually active. Contraception method is Nexplanon. Postpartum depression screening: negative.  The following portions of the patient's history were reviewed and updated as appropriate: allergies, current medications, past family history, past medical history, past social history, past surgical history and problem list.  Review of Systems Pertinent items are noted in HPI.   Objective:    BP 158/80  Wt 221 lb (100.245 kg)  BMI 37.92 kg/m2  Breastfeeding? No  General:  alert, cooperative and no distress   Breasts:  negative  Lungs: na  Heart:  na  Abdomen: soft, non-tender; bowel sounds normal; no masses,  no organomegaly   Vulva:  normal  Vagina: normal vagina  Cervix:  anteverted  Corpus: normal and normal size, contour, position, consistency, mobility, non-tender  Adnexa:  normal adnexa  Rectal Exam: Normal rectovaginal exam        Assessment:     Normal postpartum exam. Pap smear not done at today's visit.   Plan:    1. Contraception: none 2. Wants nexplanon 3. Follow up in: 10 days or as needed.

## 2012-06-28 ENCOUNTER — Other Ambulatory Visit: Payer: Medicaid Other

## 2012-06-28 ENCOUNTER — Encounter: Payer: Self-pay | Admitting: Obstetrics & Gynecology

## 2012-06-28 ENCOUNTER — Ambulatory Visit (INDEPENDENT_AMBULATORY_CARE_PROVIDER_SITE_OTHER): Payer: Medicaid Other | Admitting: Obstetrics & Gynecology

## 2012-06-28 VITALS — BP 120/70 | Wt 217.0 lb

## 2012-06-28 DIAGNOSIS — Z30017 Encounter for initial prescription of implantable subdermal contraceptive: Secondary | ICD-10-CM

## 2012-06-28 DIAGNOSIS — Z3200 Encounter for pregnancy test, result unknown: Secondary | ICD-10-CM

## 2012-06-28 MED ORDER — MEGESTROL ACETATE 40 MG PO TABS: 40.0000 mg | ORAL_TABLET | Freq: Every day | ORAL | Status: DC

## 2012-06-28 NOTE — Progress Notes (Signed)
Patient ID: [name], female   DOB: 1985/08/06, 27 y.o.   MRN: [id number] nexplanon insertion  Neg HCG serum Menses satrted 5.5.2014 - now  Left arm prepped 1% lidocaine injected Nexplanon placed withoput difficulty  Palpable  Follow up prn

## 2013-01-05 ENCOUNTER — Ambulatory Visit: Payer: Medicaid Other | Admitting: Obstetrics and Gynecology

## 2013-09-02 ENCOUNTER — Other Ambulatory Visit: Payer: Self-pay | Admitting: Obstetrics & Gynecology

## 2013-09-02 ENCOUNTER — Other Ambulatory Visit: Payer: Self-pay | Admitting: Obstetrics and Gynecology

## 2013-09-12 ENCOUNTER — Emergency Department (HOSPITAL_COMMUNITY)
Admission: EM | Admit: 2013-09-12 | Discharge: 2013-09-13 | Disposition: A | Discharge status: Home / Self Care | Payer: Medicaid Other | Attending: Emergency Medicine | Admitting: Emergency Medicine

## 2013-09-12 ENCOUNTER — Encounter (HOSPITAL_COMMUNITY): Payer: Self-pay | Admitting: Emergency Medicine

## 2013-09-12 DIAGNOSIS — Z79899 Other long term (current) drug therapy: Secondary | ICD-10-CM | POA: Insufficient documentation

## 2013-09-12 DIAGNOSIS — K625 Hemorrhage of anus and rectum: Secondary | ICD-10-CM | POA: Insufficient documentation

## 2013-09-12 DIAGNOSIS — Z9889 Other specified postprocedural states: Secondary | ICD-10-CM | POA: Insufficient documentation

## 2013-09-12 DIAGNOSIS — R1032 Left lower quadrant pain: Secondary | ICD-10-CM | POA: Insufficient documentation

## 2013-09-12 DIAGNOSIS — Z91040 Latex allergy status: Secondary | ICD-10-CM | POA: Insufficient documentation

## 2013-09-12 DIAGNOSIS — K921 Melena: Secondary | ICD-10-CM | POA: Diagnosis not present

## 2013-09-12 DIAGNOSIS — Z8619 Personal history of other infectious and parasitic diseases: Secondary | ICD-10-CM | POA: Insufficient documentation

## 2013-09-12 NOTE — ED Notes (Signed)
Pt c/o rectal bleeding since Friday.

## 2013-09-12 NOTE — ED Provider Notes (Signed)
CSN: [id number]     Arrival date & time 09/12/13  2320 History  This chart was scribed for No att. providers found,  by Ashley Jacobs, ED Scribe. The patient was seen in room APA18/APA18 and the patient's care was started at 11:52 PM.   None    Chief Complaint  Patient presents with  . Rectal Bleeding     (Consider location/radiation/quality/duration/timing/severity/associated sxs/prior Treatment) Patient is a 28 y.o. female presenting with hematochezia. The history is provided by the patient and medical records. No language interpreter was used.  Rectal Bleeding Associated symptoms: abdominal pain   Associated symptoms: no vomiting    HPI Comments: [name] is a 29 y.o. female who presents to the Emergency Department complaining of moderate rectal bleeding, onset three days ago. She describes the bleeding as bright red blood with stool and clots present. Pt explains that the bleeding flow as similar to her menses. Pt has left lower abdomen pain that is 3/10 in severity and worse with movement. Pt feels fatigue. The pain is not better with BM. Denies straining.  Denies nausea and vomiting.  Pt mentions having a hemorrhoidectomy after the birth of her second daughter. Denies the use of blood thinners. Past Medical History  Diagnosis Date  . No pertinent past medical history   . Incompetent cervix in pregnancy   . Abnormal Pap smear   . Hx of chlamydia infection   . Hx of gonorrhea    Past Surgical History  Procedure Laterality Date  . Tonsillectomy    . Cervical biopsy    . Breast surgery    . Laparoscopy for ectopic pregnancy  03/10/2011    Left Linear Salpingostomy,   . Dilation and curettage of uterus    . Cervical cerclage N/A 2006  . Ectopic pregnancy surgery Left 2013  . Cesarean section N/A 05/16/2012    Procedure: Primary cesarean section with delivery of baby girl at 1144. Apgars 8/9.;  Surgeon: Lesly Dukes, MD;  Location: [facility];  Service: Obstetrics;   Laterality: N/A;   Family History  Problem Relation Age of Onset  . Endometriosis Other   . Diabetes Other   . Hypertension Other   . Cancer Other     colon and ovarian    History  Substance Use Topics  . Smoking status: Never Smoker   . Smokeless tobacco: Not on file  . Alcohol Use: Yes     Comment: occasionally   OB History   Grav Para Term Preterm Abortions TAB SAB Ect Mult Living   9 3  3 6  5 1  3      Review of Systems  Constitutional: Positive for fatigue.  Gastrointestinal: Positive for abdominal pain, blood in stool, hematochezia, anal bleeding and rectal pain. Negative for nausea and vomiting.  All other systems reviewed and are negative.     Allergies  Alprazolam and Latex  Home Medications   Prior to Admission medications   Medication Sig Start Date End Date Taking? Authorizing Provider  megestrol (MEGACE) 40 MG tablet TAKE ONE TABLET DAILY.   Yes Lazaro Arms, MD  valACYclovir (VALTREX) 500 MG tablet Take 500 mg by mouth as needed.    Yes Historical Provider, MD  acetaminophen (TYLENOL) 500 MG tablet Take 500 mg by mouth every 6 (six) hours as needed for pain.    Historical Provider, MD  acyclovir (ZOVIRAX) 400 MG tablet TAKE (1) TABLET BY MOUTH TWICE DAILY.    Tilda Burrow, MD  calcium carbonate (TUMS - DOSED IN MG ELEMENTAL CALCIUM) 500 MG chewable tablet Chew 1-2 tablets by mouth daily as needed.     Historical Provider, MD  fexofenadine (ALLEGRA) 180 MG tablet Take 1 tablet (180 mg total) by mouth daily. 06/18/12   Lazaro ArmsLuther H Eure, MD  folic acid (FOLVITE) 800 MCG tablet Take 400 mcg by mouth daily.    Historical Provider, MD  ibuprofen (ADVIL,MOTRIN) 600 MG tablet Take 1 tablet (600 mg total) by mouth every 6 (six) hours. 05/18/12   Aviva SignsMarie L Williams, CNM  ibuprofen (ADVIL,MOTRIN) 800 MG tablet Take 1 tablet (800 mg total) by mouth every 8 (eight) hours as needed for pain. 05/21/12   Lazaro ArmsLuther H Eure, MD  oxyCODONE-acetaminophen (PERCOCET/ROXICET) 5-325 MG per  tablet Take 1-2 tablets by mouth every 4 (four) hours as needed. 05/18/12   Aviva SignsMarie L Williams, CNM  Prenatal Vit-Fe Fumarate-FA (PRENATAL S PO) Take 1 tablet by mouth daily.     Historical Provider, MD  triamterene-hydrochlorothiazide (DYAZIDE) 50-25 MG per capsule Take 1 capsule by mouth every morning. 05/21/12   Lazaro ArmsLuther H Eure, MD   BP 126/68  Pulse 73  Temp(Src) 99 F (37.2 C)  Resp 18  Ht 5\' 7"  (1.702 m)  Wt 197 lb 8 oz (89.585 kg)  BMI 30.93 kg/m2  SpO2 100% Physical Exam  Nursing note and vitals reviewed. Constitutional: She is oriented to person, place, and time. She appears well-developed and well-nourished.  HENT:  Head: Normocephalic.  Eyes: EOM are normal. Pupils are equal, round, and reactive to light.  Neck: Normal range of motion. Neck supple. No JVD present.  Cardiovascular: Normal rate, regular rhythm and normal heart sounds.   No murmur heard. Pulmonary/Chest: Effort normal.  Abdominal: Soft. She exhibits no mass. There is tenderness (mild tenderness LLQ). There is no guarding.  Genitourinary:  Rectal: no sphincter tone No hemorrhoid or masses Scant amount of blood present which is hemoccult positive.   Musculoskeletal: Normal range of motion. She exhibits no edema.  Lymphadenopathy:    She has no cervical adenopathy.  Neurological: She is alert and oriented to person, place, and time. She has normal reflexes. No cranial nerve deficit. Coordination normal.  Skin: Skin is warm and dry. No rash noted. She is not diaphoretic.  Psychiatric: She has a normal mood and affect. Her behavior is normal. Thought content normal.    ED Course  Procedures (including critical care time) DIAGNOSTIC STUDIES: Oxygen Saturation is 100% on room air, normal by my interpretation.    COORDINATION OF CARE:  11:52 PM Discussed course of care with pt . Pt understands and agrees.    Labs Review Results for orders placed during the hospital encounter of 09/12/13  CBC WITH DIFFERENTIAL       Result Value Ref Range   WBC 7.4  4.0 - 10.5 K/uL   RBC 4.29  3.87 - 5.11 MIL/uL   Hemoglobin 13.0  12.0 - 15.0 g/dL   HCT 16.138.9  09.636.0 - 04.546.0 %   MCV 90.7  78.0 - 100.0 fL   MCH 30.3  26.0 - 34.0 pg   MCHC 33.4  30.0 - 36.0 g/dL   RDW 40.912.8  81.111.5 - 91.415.5 %   Platelets 308  150 - 400 K/uL   Neutrophils Relative % 50  43 - 77 %   Neutro Abs 3.6  1.7 - 7.7 K/uL   Lymphocytes Relative 39  12 - 46 %   Lymphs Abs 2.9  0.7 - 4.0  K/uL   Monocytes Relative 10  3 - 12 %   Monocytes Absolute 0.7  0.1 - 1.0 K/uL   Eosinophils Relative 1  0 - 5 %   Eosinophils Absolute 0.1  0.0 - 0.7 K/uL   Basophils Relative 0  0 - 1 %   Basophils Absolute 0.0  0.0 - 0.1 K/uL  BASIC METABOLIC PANEL      Result Value Ref Range   Sodium 138  137 - 147 mEq/L   Potassium 4.1  3.7 - 5.3 mEq/L   Chloride 101  96 - 112 mEq/L   CO2 26  19 - 32 mEq/L   Glucose, Bld 101 (*) 70 - 99 mg/dL   BUN 10  6 - 23 mg/dL   Creatinine, Ser 4.09  0.50 - 1.10 mg/dL   Calcium 9.0  8.4 - 81.1 mg/dL   GFR calc non Af Amer >90  >90 mL/min   GFR calc Af Amer >90  >90 mL/min   Anion gap 11  5 - 15   MDM   Final diagnoses:  Rectal bleeding    Rectal bleeding of uncertain cause. Screening labs are obtained and she will have orthostatic vital signs checked.  Hemoglobin has come back normal and this is actually a significant increase over her last recorded hemoglobin in March of 2014. However, orthostatic vital signs to show borderline significant increase in heart rate without any subjective dizziness. I feel she is hemodynamically stable at this point. She will need GI evaluation and she is referred to Dr. Darrick Penna who is on call for gastroenterology. She is instructed to return if bleeding seems to be getting worse or she starts feeling dizzy or lightheaded.  I personally performed the services described in this documentation, which was scribed in my presence. The recorded information has been reviewed and is  accurate.     Dione Booze, MD 09/13/13 [phone]

## 2013-09-13 ENCOUNTER — Telehealth: Payer: Self-pay | Admitting: *Deleted

## 2013-09-13 LAB — CBC WITH DIFFERENTIAL/PLATELET
BASOS ABS: 0 10*3/uL (ref 0.0–0.1)
Basophils Relative: 0 % (ref 0–1)
EOS PCT: 1 % (ref 0–5)
Eosinophils Absolute: 0.1 10*3/uL (ref 0.0–0.7)
HEMATOCRIT: 38.9 % (ref 36.0–46.0)
Hemoglobin: 13 g/dL (ref 12.0–15.0)
LYMPHS ABS: 2.9 10*3/uL (ref 0.7–4.0)
LYMPHS PCT: 39 % (ref 12–46)
MCH: 30.3 pg (ref 26.0–34.0)
MCHC: 33.4 g/dL (ref 30.0–36.0)
MCV: 90.7 fL (ref 78.0–100.0)
MONO ABS: 0.7 10*3/uL (ref 0.1–1.0)
Monocytes Relative: 10 % (ref 3–12)
NEUTROS ABS: 3.6 10*3/uL (ref 1.7–7.7)
Neutrophils Relative %: 50 % (ref 43–77)
Platelets: 308 10*3/uL (ref 150–400)
RBC: 4.29 MIL/uL (ref 3.87–5.11)
RDW: 12.8 % (ref 11.5–15.5)
WBC: 7.4 10*3/uL (ref 4.0–10.5)

## 2013-09-13 LAB — BASIC METABOLIC PANEL
ANION GAP: 11 (ref 5–15)
BUN: 10 mg/dL (ref 6–23)
CALCIUM: 9 mg/dL (ref 8.4–10.5)
CHLORIDE: 101 meq/L (ref 96–112)
CO2: 26 meq/L (ref 19–32)
CREATININE: 0.65 mg/dL (ref 0.50–1.10)
GFR calc Af Amer: >90 mL/min (ref >90)
GFR calc non Af Amer: >90 mL/min (ref >90)
GLUCOSE: 101 mg/dL — AB (ref 70–99)
Potassium: 4.1 mEq/L (ref 3.7–5.3)
SODIUM: 138 meq/L (ref 137–147)

## 2013-09-13 NOTE — Discharge Instructions (Signed)
Return if bleeding is getting worse, or if you start getting dizzy or light-headed.  Rectal Bleeding Rectal bleeding is when blood passes out of the anus. It is usually a sign that something is wrong. It may not be serious, but it should always be evaluated. Rectal bleeding may present as bright red blood or extremely dark stools. The color may range from dark red or maroon to black (like tar). It is important that the cause of rectal bleeding be identified so treatment can be started and the problem corrected. CAUSES   Hemorrhoids. These are enlarged (dilated) blood vessels or veins in the anal or rectal area.  Fistulas. Theseare abnormal, burrowing channels that usually run from inside the rectum to the skin around the anus. They can bleed.  Anal fissures. This is a tear in the tissue of the anus. Bleeding occurs with bowel movements.  Diverticulosis. This is a condition in which pockets or sacs project from the bowel wall. Occasionally, the sacs can bleed.  Diverticulitis. Thisis an infection involving diverticulosis of the colon.  Proctitis and colitis. These are conditions in which the rectum, colon, or both, can become inflamed and pitted (ulcerated).  Polyps and cancer. Polyps are non-cancerous (benign) growths in the colon that may bleed. Certain types of polyps turn into cancer.  Protrusion of the rectum. Part of the rectum can project from the anus and bleed.  Certain medicines.  Intestinal infections.  Blood vessel abnormalities. HOME CARE INSTRUCTIONS  Eat a high-fiber diet to keep your stool soft.  Limit activity.  Drink enough fluids to keep your urine clear or pale yellow.  Warm baths may be useful to soothe rectal pain.  Follow up with your caregiver as directed. SEEK IMMEDIATE MEDICAL CARE IF:  You develop increased bleeding.  You have black or dark red stools.  You vomit blood or material that looks like coffee grounds.  You have abdominal pain or  tenderness.  You have a fever.  You feel weak, nauseous, or you faint.  You have severe rectal pain or you are unable to have a bowel movement. MAKE SURE YOU:  Understand these instructions.  Will watch your condition.  Will get help right away if you are not doing well or get worse. Document Released: 07/26/2001 Document Revised: 04/28/2011 Document Reviewed: 07/21/2010 [facility] Patient Information 2015 [place], [place]. This information is not intended to replace advice given to you by your health care provider. Make sure you discuss any questions you have with your health care provider.

## 2013-09-13 NOTE — ED Notes (Signed)
Discharge instructions reviewed with pt, questions answered. Pt verbalized understanding.  

## 2013-09-15 ENCOUNTER — Ambulatory Visit (INDEPENDENT_AMBULATORY_CARE_PROVIDER_SITE_OTHER): Payer: Medicaid Other | Admitting: Adult Health

## 2013-09-15 ENCOUNTER — Encounter: Payer: Self-pay | Admitting: Adult Health

## 2013-09-15 VITALS — BP 130/90 | Temp 98.2°F | Ht 67.0 in | Wt 194.0 lb

## 2013-09-15 DIAGNOSIS — R109 Unspecified abdominal pain: Secondary | ICD-10-CM

## 2013-09-15 DIAGNOSIS — K625 Hemorrhage of anus and rectum: Secondary | ICD-10-CM

## 2013-09-15 DIAGNOSIS — N926 Irregular menstruation, unspecified: Secondary | ICD-10-CM

## 2013-09-15 DIAGNOSIS — R195 Other fecal abnormalities: Secondary | ICD-10-CM

## 2013-09-15 HISTORY — DX: Other fecal abnormalities: R19.5

## 2013-09-15 HISTORY — DX: Hemorrhage of anus and rectum: K62.5

## 2013-09-15 HISTORY — DX: Unspecified abdominal pain: R10.9

## 2013-09-15 LAB — HEMOCCULT GUIAC POC 1CARD (OFFICE): FECAL OCCULT BLD: POSITIVE

## 2013-09-15 MED ORDER — HYDROCODONE-ACETAMINOPHEN 5-325 MG PO TABS: 1.0000 | ORAL_TABLET | Freq: Four times a day (QID) | ORAL | Status: DC | PRN

## 2013-09-15 MED ORDER — PROMETHAZINE HCL 25 MG PO TABS: 25.0000 mg | ORAL_TABLET | Freq: Four times a day (QID) | ORAL | Status: DC | PRN

## 2013-09-15 NOTE — Patient Instructions (Signed)
Diverticulitis Diverticulitis is inflammation or infection of small pouches in your colon that form when you have a condition called diverticulosis. The pouches in your colon are called diverticula. Your colon, or large intestine, is where water is absorbed and stool is formed. Complications of diverticulitis can include:  Bleeding.  Severe infection.  Severe pain.  Perforation of your colon.  Obstruction of your colon. CAUSES  Diverticulitis is caused by bacteria. Diverticulitis happens when stool becomes trapped in diverticula. This allows bacteria to grow in the diverticula, which can lead to inflammation and infection. RISK FACTORS People with diverticulosis are at risk for diverticulitis. Eating a diet that does not include enough fiber from fruits and vegetables may make diverticulitis more likely to develop. SYMPTOMS  Symptoms of diverticulitis may include:  Abdominal pain and tenderness. The pain is normally located on the left side of the abdomen, but may occur in other areas.  Fever and chills.  Bloating.  Cramping.  Nausea.  Vomiting.  Constipation.  Diarrhea.  Blood in your stool. DIAGNOSIS  Your health care provider will ask you about your medical history and do a physical exam. You may need to have tests done because many medical conditions can cause the same symptoms as diverticulitis. Tests may include:  Blood tests.  Urine tests.  Imaging tests of the abdomen, including X-rays and CT scans. When your condition is under control, your health care provider may recommend that you have a colonoscopy. A colonoscopy can show how severe your diverticula are and whether something else is causing your symptoms. TREATMENT  Most cases of diverticulitis are mild and can be treated at home. Treatment may include:  Taking over-the-counter pain medicines.  Following a clear liquid diet.  Taking antibiotic medicines by mouth for 7-10 days. More severe cases may  be treated at a hospital. Treatment may include:  Not eating or drinking.  Taking prescription pain medicine.  Receiving antibiotic medicines through an IV tube.  Receiving fluids and nutrition through an IV tube.  Surgery. HOME CARE INSTRUCTIONS   Follow your health care provider's instructions carefully.  Follow a full liquid diet or other diet as directed by your health care provider. After your symptoms improve, your health care provider may tell you to change your diet. He or she may recommend you eat a high-fiber diet. Fruits and vegetables are good sources of fiber. Fiber makes it easier to pass stool.  Take fiber supplements or probiotics as directed by your health care provider.  Only take medicines as directed by your health care provider.  Keep all your follow-up appointments. SEEK MEDICAL CARE IF:   Your pain does not improve.  You have a hard time eating food.  Your bowel movements do not return to normal. SEEK IMMEDIATE MEDICAL CARE IF:   Your pain becomes worse.  Your symptoms do not get better.  Your symptoms suddenly get worse.  You have a fever.  You have repeated vomiting.  You have bloody or black, tarry stools. MAKE SURE YOU:   Understand these instructions.  Will watch your condition.  Will get help right away if you are not doing well or get worse. Document Released: 11/13/2004 Document Revised: 02/08/2013 Document Reviewed: 12/29/2012 [facility] Patient Information 2015 [place], [place]. This information is not intended to replace advice given to you by your health care provider. Make sure you discuss any questions you have with your health care provider. CT 8/3 at 4 pm Follow up 5 days

## 2013-09-15 NOTE — Progress Notes (Signed)
Subjective:     Patient ID: [name], female   DOB: Sep 28, 1985, 28 y.o.   MRN: [id number]  HPI Kendal HymenBonnie is a 28 year old black female in complaining of rectal bleeding since Friday, [facility], referred to Dr Darrick PennaFields.She also has pain in left side at times, has had since C section 05/16/12..And is having irregular bleeding with nexplanon and has used megace in past.Has had some diarrhea and nausea.If gets cold has chills and throat feels tight.Labs in ER were normal.Pain in side about a 3,but can get more intense at times.  Review of Systems See HPI Reviewed past medical,surgical, social and family history. Reviewed medications and allergies.     Objective:   Physical Exam BP 130/90  Temp(Src) 98.2 F (36.8 C)  Ht 5\' 7"  (1.702 m)  Wt 194 lb (87.998 kg)  BMI 30.38 kg/m2  LMP 08/09/2013  Breastfeeding? No Skin warm and dry.Pelvic: external genitalia is normal in appearance, vagina:scant amount of period like blood, cervix:smooth and bulbous, uterus: normal size, shape and contour, non tender, no masses felt, adnexa: no masses, mild tenderness noted left side, no rebound.rectal exam: no hemorrhoids felt, soft stool in vault, hemoccult +.   Discussed could be diverticulitis or gastritis.Eat bland.  Assessment:     Rectal bleeding   Left side pain  + hemoccult Irregular menstrual bleeding with nexplanon  Plan:     Will get CT with contrast of abd/pelvis [facility]) Rx Phenergan 25 mg #30 1 every 6 hours prn N/V with 1 refill Rx Norco 5-325 mg #30 1 every 6 hours prn pain no refills Return to office in 5 days for follow up, will refill megace if irregular menstrual bleeding continues Keep appt 9/1 at Dr Darrick PennaFields office If increase in pain or bleeding go to ER Review handout on diverticulitis

## 2013-09-19 ENCOUNTER — Ambulatory Visit (HOSPITAL_COMMUNITY)
Admission: RE | Admit: 2013-09-19 | Discharge: 2013-09-19 | Disposition: A | Discharge status: Home / Self Care | Payer: Medicaid Other | Source: Ambulatory Visit | Attending: Adult Health | Admitting: Adult Health

## 2013-09-19 DIAGNOSIS — R109 Unspecified abdominal pain: Secondary | ICD-10-CM

## 2013-09-19 DIAGNOSIS — K625 Hemorrhage of anus and rectum: Secondary | ICD-10-CM | POA: Insufficient documentation

## 2013-09-19 MED ORDER — IOHEXOL 300 MG/ML  SOLN
100.0000 mL | Freq: Once | INTRAMUSCULAR | Status: AC | PRN
  Administered 2013-09-19: 100 mL via INTRAVENOUS

## 2013-09-20 ENCOUNTER — Encounter: Payer: Self-pay | Admitting: Adult Health

## 2013-09-20 ENCOUNTER — Ambulatory Visit (INDEPENDENT_AMBULATORY_CARE_PROVIDER_SITE_OTHER): Payer: Medicaid Other | Admitting: Adult Health

## 2013-09-20 VITALS — BP 126/74 | Ht 67.0 in | Wt 194.0 lb

## 2013-09-20 DIAGNOSIS — K625 Hemorrhage of anus and rectum: Secondary | ICD-10-CM

## 2013-09-20 DIAGNOSIS — R109 Unspecified abdominal pain: Secondary | ICD-10-CM

## 2013-09-20 NOTE — Progress Notes (Signed)
Subjective:     Patient ID: [name], female   DOB: 07-03-1985, 28 y.o.   MRN: [id number]  HPI Kendal HymenBonnie is a 28 year old female in to go over CT.She has not taken andy pain meds and says her pain is a 3 and she still has rectal bleeding, not as much.  Review of Systems See HPI Reviewed past medical,surgical, social and family history. Reviewed medications and allergies.     Objective:   Physical Exam BP 126/74  Ht 5\' 7"  (1.702 m)  Wt 194 lb (87.998 kg)  BMI 30.38 kg/m2  LMP 08/09/2013  Breastfeeding? NoReviewed CT with pt and husband:CLINICAL DATA: Rectal bleeding and pain  EXAM:  CT ABDOMEN AND PELVIS WITH CONTRAST  TECHNIQUE:  Multidetector CT imaging of the abdomen and pelvis was performed  using the standard protocol following bolus administration of  intravenous contrast.  CONTRAST: 100mL OMNIPAQUE IOHEXOL 300 MG/ML SOLN  COMPARISON: None.  FINDINGS:  The liver, spleen, pancreas, gallbladder, adrenal glands are normal.  Bilateral parapelvic renal cysts identified. The kidneys are  otherwise normal. The aorta is normal. There are small periaortic  lymph nodes not pathologically enlarged by CT size criteria. There  is no small bowel obstruction or diverticulitis. The appendix is  normal. Minimal umbilical herniation of mesenteric fat is  identified.  Fluid-filled bladder is normal. The uterus is normal. A tampon is  identified within the vaginal canal. There is mild dependent  atelectasis of the posterior lung bases. No acute abnormalities  identified within the visualized bones.  IMPRESSION:  No acute abnormality identified in the abdomen and pelvis.  Will refer to Dr Wyonia HoughFields,may need colonoscopy, has appt 9/1 with Tana CoastLeslie Lewis PA.    Assessment:     Left side abdominal pain Rectal bleeding    Plan:     Refer to Dr Darrick PennaFields Go to ER if increase in pain or bleeding or fever or vomiting Return in 3 weeks for pap and physical

## 2013-09-20 NOTE — Patient Instructions (Addendum)
Refer to Dr Darrick PennaFields  Follow up prn, return in 3 week for pap and physical

## 2013-09-21 ENCOUNTER — Telehealth: Payer: Self-pay | Admitting: Gastroenterology

## 2013-09-21 ENCOUNTER — Encounter: Payer: Self-pay | Admitting: Gastroenterology

## 2013-09-21 NOTE — Telephone Encounter (Signed)
OV made for 9/1 at 9 with LSL. ER referral.  [facility] for left side pain and rectal bleeding. She will be a new patient with [facility]. Please advise a day and time for a URGENT OV.

## 2013-09-21 NOTE — Telephone Encounter (Signed)
The first thing in the computer for urgent is 10/10/2013 at 1:15 PM with Tana CoastLeslie Lewis, PA.

## 2013-09-21 NOTE — Telephone Encounter (Signed)
Pt is aware of OV on 8/24 at 115 with LSL and appt card mailed. She is aware to disregard the previous OV for 9/1 at 9 with LSL

## 2013-10-06 ENCOUNTER — Ambulatory Visit: Payer: Self-pay | Admitting: Gastroenterology

## 2013-10-10 ENCOUNTER — Ambulatory Visit: Payer: Self-pay | Admitting: Gastroenterology

## 2013-10-11 ENCOUNTER — Other Ambulatory Visit: Payer: Self-pay | Admitting: Adult Health

## 2013-10-12 ENCOUNTER — Other Ambulatory Visit: Payer: Self-pay | Admitting: Adult Health

## 2013-10-18 ENCOUNTER — Ambulatory Visit: Payer: Medicaid Other | Admitting: Gastroenterology

## 2013-10-19 ENCOUNTER — Encounter: Payer: Self-pay | Admitting: Gastroenterology

## 2013-10-19 ENCOUNTER — Ambulatory Visit: Payer: Self-pay | Admitting: Gastroenterology

## 2013-12-19 ENCOUNTER — Encounter: Payer: Self-pay | Admitting: Adult Health

## 2014-02-13 ENCOUNTER — Telehealth: Payer: Self-pay | Admitting: *Deleted

## 2014-02-13 NOTE — Telephone Encounter (Signed)
Left message x 1. JSY 

## 2014-02-13 NOTE — Telephone Encounter (Signed)
Spoke with pt. Pt has had no period in 2 months, + headache, breasts are tender, stomach cramps, and pain in left side. Home pregnancy test was negative. I advised she would need to be seen. Pt voiced understanding.  Call transferred to front desk for appt. JSY

## 2014-02-14 ENCOUNTER — Ambulatory Visit: Payer: Self-pay | Admitting: Obstetrics and Gynecology

## 2014-02-16 ENCOUNTER — Encounter: Payer: Self-pay | Admitting: Obstetrics and Gynecology

## 2014-02-16 ENCOUNTER — Ambulatory Visit (INDEPENDENT_AMBULATORY_CARE_PROVIDER_SITE_OTHER): Payer: Self-pay | Admitting: Obstetrics and Gynecology

## 2014-02-16 VITALS — BP 120/76 | Ht 67.0 in | Wt 196.0 lb

## 2014-02-16 DIAGNOSIS — N912 Amenorrhea, unspecified: Secondary | ICD-10-CM

## 2014-02-16 DIAGNOSIS — N926 Irregular menstruation, unspecified: Secondary | ICD-10-CM

## 2014-02-16 MED ORDER — MEDROXYPROGESTERONE ACETATE 10 MG PO TABS: 10.0000 mg | ORAL_TABLET | Freq: Every day | ORAL | Status: DC

## 2014-02-16 NOTE — Progress Notes (Signed)
Patient ID: [name], female   DOB: 1985-06-05, 28 y.o.   MRN: [id number] Pt here today for no periods. Pt has not had a period in 2 months, she has breast tenderness, tenderness on her left side and cramping in her lower stomach.    [facility] Visit  Patient name: [name] MRN [id number]  Date of birth: 1985-06-05  CC & HPI:  [name] is a 28 y.o. female presenting today for pain ln left neck and chest, like a burp pain but 10 times stronger. Also LLQ pain,static, just in frontof ant sup iliac crest. No mass, no swelling. + cramps. Can be sharp and stabbing .,,,felt like menstrual bleeding. But pt never bled.  ROS:  Pt did NOT GET colonoscopy after bleeding per rectum  In summer 2015, will test hemoccult x 3 . Pt with fam hx Ca colon , hx IBS.  Pertinent History Reviewed:   Reviewed: Significant for s/p conizaiton of cervix  Medical         Past Medical History  Diagnosis Date  . No pertinent past medical history   . Incompetent cervix in pregnancy   . Abnormal Pap smear   . Hx of chlamydia infection   . Hx of gonorrhea   . Rectal bleeding 09/15/2013  . Left sided abdominal pain 09/15/2013  . Positive fecal occult blood test 09/15/2013                              Surgical Hx:    Past Surgical History  Procedure Laterality Date  . Tonsillectomy    . Cervical biopsy    . Breast surgery    . Laparoscopy for ectopic pregnancy  03/10/2011    Left Linear Salpingostomy,   . Dilation and curettage of uterus    . Cervical cerclage N/A 2006  . Ectopic pregnancy surgery Left 2013  . Cesarean section N/A 05/16/2012    Procedure: Primary cesarean section with delivery of baby girl at 1144. Apgars 8/9.;  Surgeon: Lesly DukesKelly H Leggett, MD;  Location: [facility];  Service: Obstetrics;  Laterality: N/A;   Medications: Reviewed & Updated - see associated section                      Current outpatient prescriptions: acetaminophen (TYLENOL) 500 MG tablet, Take 500 mg by mouth  every 6 (six) hours as needed for pain., Disp: , Rfl: ;  etonogestrel (NEXPLANON) 68 MG IMPL implant, Inject 1 each into the skin once., Disp: , Rfl: ;  megestrol (MEGACE) 40 MG tablet, TAKE ONE TABLET DAILY., Disp: 30 tablet, Rfl: 3   Social History: Reviewed -  reports that she has never smoked. She has never used smokeless tobacco.  Objective Findings:  Vitals: Blood pressure 120/76, height 5\' 7"  (1.702 m), weight 196 lb (88.905 kg), not currently breastfeeding.  Physical Examination: General appearance - alert, well appearing, and in no distress, oriented to person, place, and time and normal appearing weight Mental status - alert, oriented to person, place, and time, normal mood, behavior, speech, dress, motor activity, and thought processes Eyes - pupils equal and reactive, extraocular eye movements intact, sclera anicteric Abdomen - soft, nontender, nondistended, no masses or organomegaly Pelvic - VULVA: normal appearing vulva with no masses, tenderness or lesions, VAGINA: normal appearing vagina with normal color and discharge, no lesions, CERVIX: normal appearing cervix without discharge or lesions, ectropion everted  s/p LEEP, UTERUS: uterus is normal size, shape, consistency and nontender, movement of uterus causes a small amountn of pain in LLQ , ADNEXA: tenderness left, mild, with bimanual , but no  descrete fullness or mass effect,    Assessment & Plan:   A:  1. Amenorrhea,  2. Chronic llq pain 3. Hx BRBPR with out followup  P:  1. Provera 10 mg/d x 1 wk. 2.Pelvic u/s next wk. 3. Hemoccult test x 3

## 2014-02-24 ENCOUNTER — Ambulatory Visit (INDEPENDENT_AMBULATORY_CARE_PROVIDER_SITE_OTHER): Payer: Self-pay

## 2014-02-24 ENCOUNTER — Ambulatory Visit (INDEPENDENT_AMBULATORY_CARE_PROVIDER_SITE_OTHER): Payer: Self-pay | Admitting: Obstetrics and Gynecology

## 2014-02-24 VITALS — BP 122/80 | Ht 67.0 in | Wt 199.0 lb

## 2014-02-24 DIAGNOSIS — G4482 Headache associated with sexual activity: Secondary | ICD-10-CM

## 2014-02-24 DIAGNOSIS — O26899 Other specified pregnancy related conditions, unspecified trimester: Secondary | ICD-10-CM

## 2014-02-24 DIAGNOSIS — R102 Pelvic and perineal pain: Secondary | ICD-10-CM

## 2014-02-24 DIAGNOSIS — O9989 Other specified diseases and conditions complicating pregnancy, childbirth and the puerperium: Secondary | ICD-10-CM

## 2014-02-24 DIAGNOSIS — N912 Amenorrhea, unspecified: Secondary | ICD-10-CM

## 2014-02-24 NOTE — Progress Notes (Signed)
Patient ID: [name], female   DOB: 1985/03/19, 29 y.o.   MRN: [id number] Pt here today for results of US.

## 2014-02-24 NOTE — Progress Notes (Addendum)
[facility] Visit  Patient name: [name] MRN [id number]  Date of birth: 17-Oct-1985  CC & HPI:  [name] is a 29 y.o. female presenting today for results from her US. She had an US completed because of amenorrhea and LLQ pain. Denies any other associated symptoms. She notes that when she has sex there is pain. A HA comes as well once she has an orgasm. The HA will bring her to tears and she has not had sex because of it. This has only occurred since she hit her head in 2011 and the Dr. Informed her that she had probably cracked her skull.   ROS:  +Amenorrhea +LLQ pain +dyspareunia No other complaints.  Pertinent History Reviewed:   Reviewed: Significant for  Medical         Past Medical History  Diagnosis Date  . No pertinent past medical history   . Incompetent cervix in pregnancy   . Abnormal Pap smear   . Hx of chlamydia infection   . Hx of gonorrhea   . Rectal bleeding 09/15/2013  . Left sided abdominal pain 09/15/2013  . Positive fecal occult blood test 09/15/2013                              Surgical Hx:    Past Surgical History  Procedure Laterality Date  . Tonsillectomy    . Cervical biopsy    . Breast surgery    . Laparoscopy for ectopic pregnancy  03/10/2011    Left Linear Salpingostomy,   . Dilation and curettage of uterus    . Cervical cerclage N/A 2006  . Ectopic pregnancy surgery Left 2013  . Cesarean section N/A 05/16/2012    Procedure: Primary cesarean section with delivery of baby girl at 1144. Apgars 8/9.;  Surgeon: Lesly DukesKelly H Leggett, MD;  Location: [facility];  Service: Obstetrics;  Laterality: N/A;   Medications: Reviewed & Updated - see associated section                      Current outpatient prescriptions: acetaminophen (TYLENOL) 500 MG tablet, Take 500 mg by mouth every 6 (six) hours as needed for pain., Disp: , Rfl: ;  etonogestrel (NEXPLANON) 68 MG IMPL implant, Inject 1 each into the skin once., Disp: , Rfl: ;  megestrol (MEGACE) 40  MG tablet, TAKE ONE TABLET DAILY., Disp: 30 tablet, Rfl: 3 medroxyPROGESTERone (PROVERA) 10 MG tablet, Take 1 tablet (10 mg total) by mouth daily. (Patient not taking: Reported on 02/24/2014), Disp: 7 tablet, Rfl: 0   Social History: Reviewed -  reports that she has never smoked. She has never used smokeless tobacco.  Objective Findings:  Vitals: Blood pressure 122/80, height 5\' 7"  (1.702 m), weight 199 lb (90.266 kg), not currently breastfeeding.  Physical Examination:  General appearance - alert, well appearing, and in no distress and oriented to person, place, and time Mental status - alert, oriented to person, place, and time, normal mood, behavior, speech, dress, motor activity, and thought processes  US Results from 02/24/13:   [name] is a 29 y.o. [phone] for a pelvic sonogram for amenorrhea and LLQ pain.  Uterus 6.9 x 4.5 x 3.6 cm, anteverted  Endometrium 7.6 mm, symmetrical,   Right ovary 3.9 x 2.0 x 1.6 cm,   Left ovary 2.9 x 2.8 x 1.6 cm, (Pt very tender to palp with vaginal probe to  Lt ovary)  No free fluid or adnexal masses noted   Technician Comments:  Anteverted uterus, Endom-7.20mm symmetrical, bilateral adnexa/ovaries appear WNL with noted tenderness noted with palp to Lt ovary with vaginal probe   Assessment & Plan:   A:  1. Amenorrhea 2. Pelvic pain LLQ 3. Dyspareunia  4. Postcoital Headache(HASA) - Headache Associated with Sexual Activity  P:  1. Refer to Dr. Gerilyn Pilgrim for postcoital headaches  2. F/U in 2 weeks for pelvic exam  This chart was scribed for Tilda Burrow, MD by Chestine Spore, ED Scribe. The patient was seen in the office at 11:24 AM.

## 2014-03-10 ENCOUNTER — Ambulatory Visit: Payer: Self-pay | Admitting: Obstetrics and Gynecology

## 2014-03-15 ENCOUNTER — Ambulatory Visit: Payer: Self-pay | Admitting: Obstetrics and Gynecology

## 2014-03-20 ENCOUNTER — Ambulatory Visit (INDEPENDENT_AMBULATORY_CARE_PROVIDER_SITE_OTHER): Payer: Self-pay | Admitting: Obstetrics and Gynecology

## 2014-03-20 VITALS — BP 120/90 | Ht 67.0 in | Wt 198.0 lb

## 2014-03-20 DIAGNOSIS — G4482 Headache associated with sexual activity: Secondary | ICD-10-CM

## 2014-03-20 DIAGNOSIS — R102 Pelvic and perineal pain: Secondary | ICD-10-CM

## 2014-03-20 NOTE — Progress Notes (Signed)
Patient ID: APRYLL Miranda, female   DOB: 06-14-1985, 29 y.o.   MRN: [id number]   [facility] Visit  Patient name: [name] MRN [id number]  Date of birth: 07/08/1985  CC & HPI:  [name] is a 29 y.o. female presenting today to follow-up from her last visit.  She is still complaining of constant left sided abdominal pain and gradually worsening headaches. Previously described the pain as HASA, headache associated with sexual activity, but now describes headache as more frequent.   ROS:  All systems have been reviewed and are negative unless indicated in the HPI.  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  cries a lot , no HI, no Suiciday ideation.  Pertinent History Reviewed:   Reviewed: Significant for cesarean section, cervical cerclage, ectopic laparoscopy for ectopic pregnancy Medical         Past Medical History  Diagnosis Date   No pertinent past medical history    Incompetent cervix in pregnancy    Abnormal Pap smear    Hx of chlamydia infection    Hx of gonorrhea    Rectal bleeding 09/15/2013   Left sided abdominal pain 09/15/2013   Positive fecal occult blood test 09/15/2013                              Surgical Hx:    Past Surgical History  Procedure Laterality Date   Tonsillectomy     Cervical biopsy     Breast surgery     Laparoscopy for ectopic pregnancy  03/10/2011    Left Linear Salpingostomy,    Dilation and curettage of uterus     Cervical cerclage N/A 2006   Ectopic pregnancy surgery Left 2013   Cesarean section N/A 05/16/2012    Procedure: Primary cesarean section with delivery of baby girl at 1144. Apgars 8/9.;  Surgeon: Lesly Dukes, MD;  Location: [facility];  Service: Obstetrics;  Laterality: N/A;   Medications: Reviewed & Updated - see associated section                       Current outpatient prescriptions:    acetaminophen (TYLENOL) 500 MG tablet, Take 500  mg by mouth every 6 (six) hours as needed for pain., Disp: , Rfl:    etonogestrel (NEXPLANON) 68 MG IMPL implant, Inject 1 each into the skin once., Disp: , Rfl:    medroxyPROGESTERone (PROVERA) 10 MG tablet, Take 1 tablet (10 mg total) by mouth daily. (Patient not taking: Reported on 02/24/2014), Disp: 7 tablet, Rfl: 0   megestrol (MEGACE) 40 MG tablet, TAKE ONE TABLET DAILY., Disp: 30 tablet, Rfl: 3   Social History: Reviewed -  reports that she has never smoked. She has never used smokeless tobacco.  Objective Findings:  Vitals: Blood pressure 120/90, height  (1.702 m), weight 198 lb (89.812 kg), not currently breastfeeding.  Physical Examination: General appearance - alert, well appearing, and in no distress, oriented to person, place, and time and obese Well healed surgical scar.from cesarean Pelvic - VULVA: normal appearing vulva with no masses, tenderness or lesions,  VAGINA: normal appearing vagina with normal color and discharge, no lesions, good support CERVIX: normal appearing cervix without discharge or lesions, no purulence  UTERUS: uterus is normal size, shape, consistency and nontender, well-supported, anterior, small ADNEXA: normal adnexa in size, nontender  and no masses   Assessment & Plan:   A:  1. Vague pelvic complaints 2. HASA, [place] 3. General malaise. 4  Excess work hours.  P:  1. Will ask pt to keep pain calendar and Menstrual calendar on same paper. [facility] This chart was scribed for Tilda BurrowJohn Ferguson V, MD by Carl Bestelina Holson, ED Scribe. This patient was seen in Room 1 and the patient's care was started at 3:14 PM.

## 2014-03-20 NOTE — Progress Notes (Signed)
Patient ID: [name], female   DOB: [birth year], 29 y.o.   MRN: [id number] Pt here today for follow from last visit. Pain and headaches have gotten worse. Pt states that she is very overwhelmed from work and everything. Pt very tearful today.

## 2014-04-17 ENCOUNTER — Ambulatory Visit: Payer: Self-pay | Admitting: Obstetrics and Gynecology

## 2014-04-19 ENCOUNTER — Ambulatory Visit: Payer: Self-pay | Admitting: Obstetrics and Gynecology

## 2014-04-19 ENCOUNTER — Encounter: Payer: Self-pay | Admitting: Obstetrics and Gynecology

## 2014-10-01 ENCOUNTER — Emergency Department (HOSPITAL_COMMUNITY)
Admission: EM | Admit: 2014-10-01 | Discharge: 2014-10-01 | Disposition: A | Discharge status: Home / Self Care | Payer: Self-pay | Attending: Emergency Medicine | Admitting: Emergency Medicine

## 2014-10-01 ENCOUNTER — Encounter (HOSPITAL_COMMUNITY): Payer: Self-pay | Admitting: Emergency Medicine

## 2014-10-01 DIAGNOSIS — Z91040 Latex allergy status: Secondary | ICD-10-CM | POA: Insufficient documentation

## 2014-10-01 DIAGNOSIS — Z8619 Personal history of other infectious and parasitic diseases: Secondary | ICD-10-CM | POA: Insufficient documentation

## 2014-10-01 DIAGNOSIS — K2970 Gastritis, unspecified, without bleeding: Secondary | ICD-10-CM | POA: Insufficient documentation

## 2014-10-01 DIAGNOSIS — Z3202 Encounter for pregnancy test, result negative: Secondary | ICD-10-CM | POA: Insufficient documentation

## 2014-10-01 LAB — CBC WITH DIFFERENTIAL/PLATELET
BASOS ABS: 0 10*3/uL (ref 0.0–0.1)
Basophils Relative: 0 % (ref 0–1)
Eosinophils Absolute: 0 10*3/uL (ref 0.0–0.7)
Eosinophils Relative: 0 % (ref 0–5)
HEMATOCRIT: 43.7 % (ref 36.0–46.0)
Hemoglobin: 14.6 g/dL (ref 12.0–15.0)
LYMPHS PCT: 25 % (ref 12–46)
Lymphs Abs: 2.4 10*3/uL (ref 0.7–4.0)
MCH: 30.7 pg (ref 26.0–34.0)
MCHC: 33.4 g/dL (ref 30.0–36.0)
MCV: 91.8 fL (ref 78.0–100.0)
MONO ABS: 1 10*3/uL (ref 0.1–1.0)
Monocytes Relative: 11 % (ref 3–12)
NEUTROS ABS: 6.2 10*3/uL (ref 1.7–7.7)
NEUTROS PCT: 64 % (ref 43–77)
PLATELETS: 343 10*3/uL (ref 150–400)
RBC: 4.76 MIL/uL (ref 3.87–5.11)
RDW: 12.8 % (ref 11.5–15.5)
WBC: 9.6 10*3/uL (ref 4.0–10.5)

## 2014-10-01 LAB — URINALYSIS, ROUTINE W REFLEX MICROSCOPIC
Bilirubin Urine: NEGATIVE
GLUCOSE, UA: NEGATIVE mg/dL
KETONES UR: NEGATIVE mg/dL
LEUKOCYTES UA: NEGATIVE
NITRITE: NEGATIVE
PROTEIN: NEGATIVE mg/dL
Specific Gravity, Urine: 1.02 (ref 1.005–1.030)
Urobilinogen, UA: 0.2 mg/dL (ref 0.0–1.0)
pH: 6 (ref 5.0–8.0)

## 2014-10-01 LAB — URINE MICROSCOPIC-ADD ON

## 2014-10-01 LAB — COMPREHENSIVE METABOLIC PANEL
ALBUMIN: 4.4 g/dL (ref 3.5–5.0)
ALK PHOS: 135 U/L — AB (ref 38–126)
ALT: 22 U/L (ref 14–54)
AST: 15 U/L (ref 15–41)
Anion gap: 9 (ref 5–15)
BILIRUBIN TOTAL: 0.3 mg/dL (ref 0.3–1.2)
BUN: 9 mg/dL (ref 6–20)
CALCIUM: 9.9 mg/dL (ref 8.9–10.3)
CO2: 28 mmol/L (ref 22–32)
Chloride: 102 mmol/L (ref 101–111)
Creatinine, Ser: 0.64 mg/dL (ref 0.44–1.00)
Glucose, Bld: 113 mg/dL — ABNORMAL HIGH (ref 65–99)
Potassium: 3.7 mmol/L (ref 3.5–5.1)
Sodium: 139 mmol/L (ref 135–145)
Total Protein: 8.4 g/dL — ABNORMAL HIGH (ref 6.5–8.1)

## 2014-10-01 LAB — POC OCCULT BLOOD, ED: FECAL OCCULT BLD: NEGATIVE

## 2014-10-01 LAB — LIPASE, BLOOD: LIPASE: 24 U/L (ref 22–51)

## 2014-10-01 LAB — PREGNANCY, URINE: Preg Test, Ur: NEGATIVE

## 2014-10-01 MED ORDER — ONDANSETRON HCL 4 MG/2ML IJ SOLN
4.0000 mg | Freq: Once | INTRAMUSCULAR | Status: AC
  Administered 2014-10-01: 4 mg via INTRAVENOUS
  Filled 2014-10-01: qty 2

## 2014-10-01 MED ORDER — HYDROCODONE-ACETAMINOPHEN 5-325 MG PO TABS: 1.0000 | ORAL_TABLET | Freq: Four times a day (QID) | ORAL | Status: DC | PRN

## 2014-10-01 MED ORDER — RANITIDINE HCL 150 MG PO CAPS: 150.0000 mg | ORAL_CAPSULE | Freq: Two times a day (BID) | ORAL | Status: DC

## 2014-10-01 MED ORDER — HYDROMORPHONE HCL 1 MG/ML IJ SOLN
1.0000 mg | Freq: Once | INTRAMUSCULAR | Status: AC
Start: 2014-10-01 — End: 2014-10-01
  Administered 2014-10-01: 1 mg via INTRAVENOUS
  Filled 2014-10-01: qty 1

## 2014-10-01 MED ORDER — PANTOPRAZOLE SODIUM 40 MG IV SOLR
40.0000 mg | Freq: Once | INTRAVENOUS | Status: AC
  Administered 2014-10-01: 40 mg via INTRAVENOUS
  Filled 2014-10-01: qty 40

## 2014-10-01 NOTE — ED Provider Notes (Signed)
CSN: [id number]     Arrival date & time 10/01/14  1025 History  This chart was scribed for Bethann Berkshire, MD by Littie Deeds, ED Scribe. This patient was seen in room APA19/APA19 and the patient's care was started at 11:14 AM.       Chief Complaint  Patient presents with  . Abdominal Pain   Patient is a 29 y.o. female presenting with abdominal pain. The history is provided by the patient. No language interpreter was used.  Abdominal Pain Pain location:  Epigastric and periumbilical Pain quality: burning and sharp   Duration:  1 day Progression:  Unchanged Associated symptoms: melena and vaginal bleeding   Associated symptoms: no chest pain, no cough, no diarrhea, no fatigue, no hematochezia and no hematuria    HPI Comments: [name] is a 29 y.o. female who presents to the Emergency Department complaining of sharp, burning abdominal pain from the epigastrium to umbilicus that started yesterday morning. Patient reports having dark stools that are "almost black." She has also had vaginal bleeding for the past month. Patient notes that she had similar abdominal pain last year which started with rectal bleeding of bright red blood. She was told she had a hernia and was scheduled for a colonoscopy; however, she missed her appointment. Patient denies rectal bleeding at this time.   Past Medical History  Diagnosis Date  . No pertinent past medical history   . Incompetent cervix in pregnancy   . Abnormal Pap smear   . Hx of chlamydia infection   . Hx of gonorrhea   . Rectal bleeding 09/15/2013  . Left sided abdominal pain 09/15/2013  . Positive fecal occult blood test 09/15/2013   Past Surgical History  Procedure Laterality Date  . Tonsillectomy    . Cervical biopsy    . Breast surgery    . Laparoscopy for ectopic pregnancy  03/10/2011    Left Linear Salpingostomy,   . Dilation and curettage of uterus    . Cervical cerclage N/A 2006  . Ectopic pregnancy surgery Left 2013  . Cesarean  section N/A 05/16/2012    Procedure: Primary cesarean section with delivery of baby girl at 1144. Apgars 8/9.;  Surgeon: Lesly Dukes, MD;  Location: [facility];  Service: Obstetrics;  Laterality: N/A;   Family History  Problem Relation Age of Onset  . Endometriosis Other   . Diabetes Other   . Hypertension Other   . Cancer Other     colon and ovarian   . Asthma Sister   . Allergies Sister   . Eczema Sister   . Asthma Brother   . Allergies Brother   . Eczema Brother   . Asthma Daughter   . Allergies Daughter   . Eczema Daughter   . Thyroid disease Maternal Grandmother     had thyroid removed  . Cancer Maternal Grandfather     lung  . Cancer Paternal Grandmother     ovarian,stomach  . Cancer Paternal Grandfather     prostate  . Allergies Daughter   . Eczema Daughter    Social History  Substance Use Topics  . Smoking status: Never Smoker   . Smokeless tobacco: Never Used  . Alcohol Use: Yes     Comment: occasionally   OB History    Gravida Para Term Preterm AB TAB SAB Ectopic Multiple Living   9 3  3 6  5 1  3      Review of Systems  Constitutional: Negative for appetite  change and fatigue.  HENT: Negative for congestion, ear discharge and sinus pressure.   Eyes: Negative for discharge.  Respiratory: Negative for cough.   Cardiovascular: Negative for chest pain.  Gastrointestinal: Positive for abdominal pain and melena. Negative for diarrhea and hematochezia.  Genitourinary: Positive for vaginal bleeding. Negative for frequency and hematuria.  Musculoskeletal: Negative for back pain.  Skin: Negative for rash.  Neurological: Negative for seizures and headaches.  Psychiatric/Behavioral: Negative for hallucinations.      Allergies  Alprazolam and Latex  Home Medications   Prior to Admission medications   Medication Sig Start Date End Date Taking? Authorizing Provider  acetaminophen (TYLENOL) 500 MG tablet Take 500 mg by mouth every 6 (six) hours as needed for  pain.    Historical Provider, MD  etonogestrel (NEXPLANON) 68 MG IMPL implant Inject 1 each into the skin once.    Historical Provider, MD   BP 149/87 mmHg  Pulse 59  Temp(Src) 97.9 F (36.6 C) (Oral)  Resp 18  Ht  (1.753 m)  Wt 215 lb (97.523 kg)  BMI 31.74 kg/m2  SpO2 99%  LMP 08/01/2014 Physical Exam  Constitutional: She is oriented to person, place, and time. She appears well-developed.  HENT:  Head: Normocephalic.  Eyes: Conjunctivae and EOM are normal. No scleral icterus.  Neck: Neck supple. No thyromegaly present.  Cardiovascular: Normal rate and regular rhythm.  Exam reveals no gallop and no friction rub.   No murmur heard. Pulmonary/Chest: No stridor. She has no wheezes. She has no rales. She exhibits no tenderness.  Abdominal: She exhibits no distension. There is tenderness. There is no rebound.  Mild epigastric tenderness.  Musculoskeletal: Normal range of motion. She exhibits no edema.  Lymphadenopathy:    She has no cervical adenopathy.  Neurological: She is oriented to person, place, and time. She exhibits normal muscle tone. Coordination normal.  Skin: No rash noted. No erythema.  Psychiatric: She has a normal mood and affect. Her behavior is normal.    ED Course  Procedures  DIAGNOSTIC STUDIES: Oxygen Saturation is 99% on room air, normal by my interpretation.    COORDINATION OF CARE: 11:17 AM-Discussed treatment plan which includes medications with patient/guardian at bedside and patient/guardian agreed to plan.    Labs Review Labs Reviewed - No data to display  Imaging Review No results found. I personally reviewed and evaluated these images and lab results as part of my medical decision-making.   EKG Interpretation None      MDM   Final diagnoses:  None    abd pain,  Unremarkable labs,  Pt improved with tx,  Suspect gastritis,  Doubt gall bladder disease or cad.  tx pt with zantac,  vicodin and follow up with gi or pcp   The chart was  scribed for me under my direct supervision.  I personally performed the history, physical, and medical decision making and all procedures in the evaluation of this patient.Bethann Berkshire, MD 10/01/14 [phone]

## 2014-10-01 NOTE — ED Notes (Signed)
Pt reports vaginal bleeding x1 month. Pt reports abdominal pain started last night. Pt reports burning sensation from epigastric to umbilicus. Pt reports history of same last year. Pt reports was told had abdominal hernia. Pt reports never scheduled a colonoscopy for recommended follow-up. nad noted.

## 2014-10-01 NOTE — Discharge Instructions (Signed)
Follow up with dr. Jena Gauss or a family md in 1-2 weeks

## 2015-05-21 ENCOUNTER — Ambulatory Visit (INDEPENDENT_AMBULATORY_CARE_PROVIDER_SITE_OTHER): Payer: BLUE CROSS/BLUE SHIELD | Admitting: Obstetrics and Gynecology

## 2015-05-21 ENCOUNTER — Other Ambulatory Visit (HOSPITAL_COMMUNITY)
Admission: RE | Admit: 2015-05-21 | Discharge: 2015-05-21 | Disposition: A | Discharge status: Home / Self Care | Payer: BLUE CROSS/BLUE SHIELD | Source: Ambulatory Visit | Attending: Obstetrics and Gynecology | Admitting: Obstetrics and Gynecology

## 2015-05-21 ENCOUNTER — Encounter: Payer: Self-pay | Admitting: Obstetrics and Gynecology

## 2015-05-21 VITALS — BP 128/80 | Ht 66.0 in | Wt 213.0 lb

## 2015-05-21 DIAGNOSIS — Z01411 Encounter for gynecological examination (general) (routine) with abnormal findings: Secondary | ICD-10-CM | POA: Diagnosis not present

## 2015-05-21 DIAGNOSIS — Z1151 Encounter for screening for human papillomavirus (HPV): Secondary | ICD-10-CM | POA: Insufficient documentation

## 2015-05-21 DIAGNOSIS — N9419 Other specified dyspareunia: Secondary | ICD-10-CM | POA: Diagnosis not present

## 2015-05-21 DIAGNOSIS — N930 Postcoital and contact bleeding: Secondary | ICD-10-CM | POA: Diagnosis not present

## 2015-05-21 DIAGNOSIS — Z01419 Encounter for gynecological examination (general) (routine) without abnormal findings: Secondary | ICD-10-CM

## 2015-05-21 NOTE — Progress Notes (Signed)
Patient ID: [name], female   DOB: August 08, 1985, 30 y.o.   MRN: [id number]  Assessment:  1. Annual Gyn Exam 2. Dyspareunia  3. Post coital bleeding  Due to cervical eversion Plan:  1. pap smear done, next pap due in 1 year  2. return annually or prn 3.         Annual mammogram advised 4.         Barium swallow test   Subjective:  [name] is a 30 y.o. female [phone] who presents for annual exam. No LMP recorded. The patient has several complaints today.   First, pt complains of dyspareunia and frequent vaginal bleeding. Pt specifies the bleeding often begins after sexual intercourse.    Second, pt complains of recurrent, intermittent, burning pain to bilateral knees with left being worse than right; modifying factors include walking.  Third, pt complains of frequent sensation that food is getting stuck in her esophagus when eating with associated SOB. Pt denies any associated bitterness in her mouth with said Sx. Pt further denies any Hx of acid reflux. Pt reports she has lost the weight she gained during pregnancy, however, denies any recent drastic weight gain or loss.   The following portions of the patient's history were reviewed and updated as appropriate: allergies, current medications, past family history, past medical history, past social history, past surgical history and problem list. Past Medical History  Diagnosis Date  . No pertinent past medical history   . Incompetent cervix in pregnancy   . Abnormal Pap smear   . Hx of chlamydia infection   . Hx of gonorrhea   . Rectal bleeding 09/15/2013  . Left sided abdominal pain 09/15/2013  . Positive fecal occult blood test 09/15/2013    Past Surgical History  Procedure Laterality Date  . Tonsillectomy    . Cervical biopsy    . Breast surgery    . Laparoscopy for ectopic pregnancy  03/10/2011    Left Linear Salpingostomy,   . Dilation and curettage of uterus    . Cervical cerclage N/A 2006  . Ectopic pregnancy  surgery Left 2013  . Cesarean section N/A 05/16/2012    Procedure: Primary cesarean section with delivery of baby girl at 1144. Apgars 8/9.;  Surgeon: Lesly Dukes, MD;  Location: [facility];  Service: Obstetrics;  Laterality: N/A;     Current outpatient prescriptions:  .  acetaminophen (TYLENOL) 500 MG tablet, Take 500 mg by mouth every 6 (six) hours as needed for pain., Disp: , Rfl:  .  etonogestrel (NEXPLANON) 68 MG IMPL implant, Inject 1 each into the skin once., Disp: , Rfl:   Review of Systems Review of Systems  Constitutional: Positive for weight loss (pt reports losing weight gained during pregnancy).  HENT:       Positive for trouble swallowing   Respiratory: Positive for shortness of breath.   Genitourinary:       Dyspareunia and frequent vaginal bleeding  Musculoskeletal:       Burning pain to bilateral knees with left being worse than right  All other systems reviewed and are negative.    Objective:  BP 128/80 mmHg  Ht  (1.676 m)  Wt 213 lb (96.616 kg)  BMI 34.40 kg/m2  LMP    BMI: Body mass index is 34.4 kg/(m^2).  General Appearance: Alert, appropriate appearance for age. No acute distress HEENT: Grossly normal Neck / Thyroid:  Cardiovascular: RRR; normal S1, S2, no murmur Lungs: CTA bilaterally Back:  No CVAT Breast Exam: bilateral fibrocystic changes to outer quadrant. No masses or nodes.No dimpling, nipple retraction or discharge. Gastrointestinal: Soft, non-tender, no masses or organomegaly Pelvic Exam: Vulva and vagina appear normal. Bimanual exam reveals normal uterus and adnexa. External genitalia: normal general appearance Urinary system: urethral meatus normal Vaginal: normal mucosa without prolapse or lesions, normal without tenderness, induration or masses and normal rugae Cervix: everted secondary to LEEP and bleeds easily Adnexa: normal bimanual exam Uterus: normal single, nontender and anteverted Rectovaginal: not indicated Lymphatic Exam:  Non-palpable nodes in neck, clavicular, axillary, or inguinal regions Skin: no rash or abnormalities Neurologic: Normal gait and speech, no tremor  Psychiatric: Alert and oriented, appropriate affect.  Urinalysis:Not done  Discussed with pt risks and benefits of Cryosurgery and tubal ligation. At end of discussion, pt had opportunity to ask questions and has no further questions at this time.   Greater than 50% was spent in counseling and coordination of care with the patient. Total time greater than: 15 minutes   Christin BachJohn Hieu Herms. MD Pgr [phone] 3:48 PM   By signing my name below, I, Marica OtterNusrat Rahman, attest that this documentation has been prepared under the direction and in the presence of Christin BachJohn Shaneequa Bahner, MD. Electronically Signed: Marica OtterNusrat Rahman, ED Scribe. 05/21/2015. 3:40 PM    I personally performed the services described in this documentation, which was SCRIBED in my presence. The recorded information has been reviewed and considered accurate. It has been edited as necessary during review. Tilda BurrowFERGUSON,Zyad Boomer V, MD

## 2015-05-21 NOTE — Progress Notes (Signed)
Patient ID: [name], female   DOB: 10/09/1985, 30 y.o.   MRN: [id number] Pt here today for annual exam. Pt would like to discuss constant bleeding.

## 2015-05-22 ENCOUNTER — Other Ambulatory Visit: Payer: Self-pay | Admitting: *Deleted

## 2015-05-22 DIAGNOSIS — Z01419 Encounter for gynecological examination (general) (routine) without abnormal findings: Secondary | ICD-10-CM

## 2015-05-23 LAB — CYTOLOGY - PAP

## 2015-05-28 ENCOUNTER — Telehealth: Payer: Self-pay | Admitting: *Deleted

## 2015-05-28 ENCOUNTER — Telehealth: Payer: Self-pay | Admitting: Obstetrics and Gynecology

## 2015-05-28 NOTE — Telephone Encounter (Signed)
Pt called stating that she wants to go ahead and have the hysterectomy by May 17th. Please contact pt

## 2015-05-28 NOTE — Telephone Encounter (Signed)
Pt aware of pap results and aware that she will not need a colpo due to the hysterectomy she will have in the future per Dr. Emelda FearFerguson. Pt has an appointment already scheduled for the 19th of this month so we will just make sure it is for a pre op.

## 2015-05-28 NOTE — Telephone Encounter (Signed)
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 Please schedule

## 2015-05-28 NOTE — Telephone Encounter (Signed)
Abnormal pap does not need colpo per Dr.Ferguson. Pt needs a pre op appointment for discussion and possible scheduling surgery per Dr. Emelda FearFerguson.

## 2015-05-28 NOTE — Telephone Encounter (Signed)
Pt informed of abnormal pap (ASCUS, +HPV) from 05/21/2015. Colposcopy recommended, Colposcopy scheduled for 06/06/2015 at 2 pm with Dr.Ferguson. Colposcopy procedure discussed all questions answered and pt verbalized understanding.

## 2015-05-29 NOTE — Progress Notes (Signed)
Scheduled for 06-06-15

## 2015-06-06 ENCOUNTER — Encounter: Payer: Self-pay | Admitting: Obstetrics and Gynecology

## 2015-06-06 ENCOUNTER — Ambulatory Visit (INDEPENDENT_AMBULATORY_CARE_PROVIDER_SITE_OTHER): Payer: BLUE CROSS/BLUE SHIELD | Admitting: Obstetrics and Gynecology

## 2015-06-06 VITALS — BP 130/76 | Ht 67.0 in | Wt 212.0 lb

## 2015-06-06 DIAGNOSIS — Z3200 Encounter for pregnancy test, result unknown: Secondary | ICD-10-CM

## 2015-06-06 DIAGNOSIS — N941 Unspecified dyspareunia: Secondary | ICD-10-CM

## 2015-06-06 DIAGNOSIS — N9412 Deep dyspareunia: Secondary | ICD-10-CM | POA: Diagnosis not present

## 2015-06-06 DIAGNOSIS — R102 Pelvic and perineal pain: Secondary | ICD-10-CM | POA: Diagnosis not present

## 2015-06-06 DIAGNOSIS — R896 Abnormal cytological findings in specimens from other organs, systems and tissues: Secondary | ICD-10-CM | POA: Diagnosis not present

## 2015-06-06 DIAGNOSIS — Z3202 Encounter for pregnancy test, result negative: Secondary | ICD-10-CM | POA: Diagnosis not present

## 2015-06-06 DIAGNOSIS — IMO0002 Reserved for concepts with insufficient information to code with codable children: Secondary | ICD-10-CM

## 2015-06-06 NOTE — Progress Notes (Signed)
Patient ID: [name], female   DOB: February 23, 1985, 30 y.o.   MRN: [id number] Pt here today for preop exam and schedule hysterectomy.

## 2015-06-06 NOTE — Progress Notes (Signed)
Patient ID: Tracy GripBonnie L Lager, female   DOB: May 07, 1985, 30 y.o.   MRN: [id number]    Preoperative History and Physical   [name] is a 30 y.o. [phone] here for surgical management of dyspareunia and pelvic pain, as well as ASCUS on pap smear.   No significant preoperative concerns.  Patient had originally been scheduled for a colposcopy to evaluate her ASCUS results; however, as she was already considering a hysterectomy, we will do a pre-op visit today.  She denies any current URI-like symptoms.  Patient states she has an Implanon currently in her arm, to be removed during the surgery.   Proposed surgery: Abdominal hysterectomy with removal of cervix, and bilateral salpingectomy, sparing the ovaries   Past Medical History  Diagnosis Date  . No pertinent past medical history   . Incompetent cervix in pregnancy   . Abnormal Pap smear   . Hx of chlamydia infection   . Hx of gonorrhea   . Rectal bleeding 09/15/2013  . Left sided abdominal pain 09/15/2013  . Positive fecal occult blood test 09/15/2013  . Vaginal Pap smear, abnormal    Past Surgical History  Procedure Laterality Date  . Tonsillectomy    . Cervical biopsy    . Breast surgery    . Laparoscopy for ectopic pregnancy  03/10/2011    Left Linear Salpingostomy,   . Dilation and curettage of uterus    . Cervical cerclage N/A 2006  . Ectopic pregnancy surgery Left 2013  . Cesarean section N/A 05/16/2012    Procedure: Primary cesarean section with delivery of baby girl at 1144. Apgars 8/9.;  Surgeon: Lesly DukesKelly H Leggett, MD;  Location: [facility];  Service: Obstetrics;  Laterality: N/A;   OB History  Gravida Para Term Preterm AB SAB TAB Ectopic Multiple Living  9 3  3 6 5  1  3     # Outcome Date GA Lbr Len/2nd Weight Sex Delivery Anes PTL Lv  9 Preterm 05/16/12 6070w2d  4 lb 13.4 oz (2.195 kg) F CS-LTranv Spinal  Y     Comments: none  8 Ectopic 2013             Comments: Left ectopic  7 Preterm 06/03/04 4548w4d   F Vag-Spont   Y      Comments: Had cerclage  6 Preterm 11/04/02 3723w0d   F Vag-Spont None  Y     Comments: Incompetent cervix  5 SAB           4 SAB           3 SAB           2 SAB           1 SAB             Patient denies any other pertinent gynecologic issues.   Current Outpatient Prescriptions on File Prior to Visit  Medication Sig Dispense Refill  . acetaminophen (TYLENOL) 500 MG tablet Take 500 mg by mouth every 6 (six) hours as needed for pain.    Marland Kitchen. etonogestrel (NEXPLANON) 68 MG IMPL implant Inject 1 each into the skin once.     No current facility-administered medications on file prior to visit.   Allergies  Allergen Reactions  . Alprazolam     UNKNOWN REACTION: Placed in Hospital  . Latex     Contraceptive    Social History:   reports that she has never smoked. She has never used smokeless tobacco. She reports that she  drinks alcohol. She reports that she does not use illicit drugs.  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  Family History  Problem Relation Age of Onset  . Endometriosis Other   . Diabetes Other   . Hypertension Other   . Cancer Other     colon and ovarian   . Asthma Sister   . Allergies Sister   . Eczema Sister   . Asthma Brother   . Allergies Brother   . Eczema Brother   . Asthma Daughter   . Allergies Daughter   . Eczema Daughter   . Thyroid disease Maternal Grandmother     had thyroid removed  . Cancer Maternal Grandfather     lung  . Cancer Paternal Grandmother     ovarian,stomach  . Cancer Paternal Grandfather     prostate  . Allergies Daughter   . Eczema Daughter     Review of Systems: Noncontributory  PHYSICAL EXAM: Blood pressure 130/76, height  (1.702 m), weight 212 lb (96.163 kg). General appearance - alert, well appearing, and in no distress Chest - clear to auscultation, no wheezes, rales or rhonchi, symmetric air entry Heart - normal rate and regular rhythm Abdomen - soft, nontender, nondistended, no  masses or organomegaly                     Pelvic - examination  Pelvic exam: normal external genitalia, vulva, vagina, cervix, uterus and adnexa,  VULVA: normal appearing vulva with no masses, tenderness or lesions,  VAGINA: normal appearing vagina with normal color and discharge, no lesions,  CERVIX: normal appearing cervix without discharge or lesions, well-supported, tender to touch, tolerated poorly by patient UTERUS: uterus is sensitive to touch, tolerated poorly by patient, but normal size, shape, consistency and nontender ADNEXA: normal adnexa in size, nontender and no masses.  Extremities - peripheral pulses normal, no pedal edema, no clubbing or cyanosis  Labs: No results found for this or any previous visit (from the past 336 hour(s)).  Imaging Studies: No results found.  Assessment: Patient Active Problem List   Diagnosis Date Noted  . Headache associated with sexual activity 03/20/2014  . Pelvic pain in female 03/20/2014  . Coital headache 02/24/2014  . Pelvic pain affecting pregnancy 02/24/2014  . Rectal bleeding 09/15/2013  . Left sided abdominal pain 09/15/2013  . Positive fecal occult blood test 09/15/2013  . Hx of abnormal Pap smear 06/17/2012    Plan: Patient will undergo surgical management with abdominal hysterectomy with removal of cervix, and bilateral salpingectomy, sparing the ovaries. Also Nexplanon removal   Will schedule in 2 weeks.     .mec 06/06/2015 2:27 PM  By signing my name below, I, Ronney Lion, attest that this documentation has been prepared under the direction and in the presence of Tilda Burrow, MD. Electronically Signed: Ronney Lion, ED Scribe. 06/06/2015. 3:02 PM.   I personally performed the services described in this documentation, which was SCRIBED in my presence. The recorded information has been reviewed and considered accurate. It has been edited as necessary during review. Tilda Burrow, MD

## 2015-06-20 NOTE — Patient Instructions (Signed)
[name]  06/20/2015     @PREFPERIOPPHARMACY @   Your procedure is scheduled on 06/26/2015  [facility]  Call this number if you have problems the morning of surgery:  [phone]   Remember:  Do not eat food or drink liquids after midnight.  Take these medicines the morning of surgery with A SIP OF WATER NA   Do not wear jewelry, make-up or nail polish.  Do not wear lotions, powders, or perfumes.  You may wear deodorant.  Do not shave 48 hours prior to surgery.  Men may shave face and neck.  Do not bring valuables to the hospital.  [facility] any belongings or valuables.  Contacts, dentures or bridgework may not be worn into surgery.  Leave your suitcase in the car.  After surgery it may be brought to your room.  For patients admitted to the hospital, discharge time will be determined by your treatment team.  Patients discharged the day of surgery will not be allowed to drive home.    Please read over the following fact sheets that you were given. Surgical Site Infection Prevention and Anesthesia Post-op Instructions     PATIENT INSTRUCTIONS POST-ANESTHESIA  IMMEDIATELY FOLLOWING SURGERY:  Do not drive or operate machinery for the first twenty four hours after surgery.  Do not make any important decisions for twenty four hours after surgery or while taking narcotic pain medications or sedatives.  If you develop intractable nausea and vomiting or a severe headache please notify your doctor immediately.  FOLLOW-UP:  Please make an appointment with your surgeon as instructed. You do not need to follow up with anesthesia unless specifically instructed to do so.  WOUND CARE INSTRUCTIONS (if applicable):  Keep a dry clean dressing on the anesthesia/puncture wound site if there is drainage.  Once the wound has quit draining you may leave it open to air.  Generally you should leave the bandage intact for twenty four hours unless  there is drainage.  If the epidural site drains for more than 36-48 hours please call the anesthesia department.  QUESTIONS?:  Please feel free to call your physician or the hospital operator if you have any questions, and they will be happy to assist you.      Salpingectomy Salpingectomy, also called tubectomy, is the surgical removal of one of the fallopian tubes. The fallopian tubes are tubes that are connected to the uterus. These tubes transport the egg from the ovary to the uterus. A salpingectomy may be done for various reasons, including:   A tubal (ectopic) pregnancy. This is especially true if the tube ruptures.  An infected fallopian tube.  The need to remove the fallopian tube when removing an ovary with a cyst or tumor.  The need to remove the fallopian tube when removing the uterus.  Cancer of the fallopian tube or nearby organs. Removing one fallopian tube does not prevent you from becoming pregnant. It also does not cause problems with your menstrual periods.  LET [facility] PROVIDER KNOW ABOUT:  Any allergies you have.  All medicines you are taking, including vitamins, herbs, eye drops, creams, and over-the-counter medicines.  Previous problems you or members of your family have had with the use of anesthetics.  Any blood disorders you have.  Previous surgeries you have had.  Medical conditions you have. RISKS AND COMPLICATIONS  Generally, this is a safe procedure. However, as with any procedure, complications can  occur. Possible complications include:  Injury to surrounding organs.  Bleeding.  Infection.  Problems related to anesthesia. BEFORE THE PROCEDURE  Ask your health care provider about changing or stopping your regular medicines. You may need to stop taking certain medicines, such as aspirin or blood thinners, at least 1 week before the surgery.  Do not eat or drink anything for at least 8 hours before the surgery.  If you smoke, do not  smoke for at least 2 weeks before the surgery.  Make plans to have someone drive you home after the procedure or after your hospital stay. Also arrange for someone to help you with activities during recovery. PROCEDURE   You will be given medicine to help you relax before the procedure (sedative). You will then be given medicine to make you sleep through the procedure (general anesthetic). These medicines will be given through an IV access tube that is put into one of your veins.  Once you are asleep, your lower abdomen will be shaved and cleaned. A thin, flexible tube (catheter) will be placed in your bladder.  The surgeon may use a laparoscopic, robotic, or open technique for this surgery:  In the laparoscopic technique, the surgery is done through two small cuts (incisions) in the abdomen. A thin, lighted tube with a tiny camera on the end (laparoscope) is inserted into one of the incisions. The tools needed for the procedure are put through the other incision.  A robotic technique may be chosen to perform complex surgery in a small space. In the robotic technique, small incisions will be made. A camera and surgical instruments are passed through the incisions. Surgical instruments will be controlled with the help of a robotic arm.  In the open technique, the surgery is done through one large incision in the abdomen.  Using any of these techniques, the surgeon removes the fallopian tube from where it attaches to the uterus. The blood vessels will be clamped and tied.  The surgeon then uses staples or stitches to close the incision or incisions. AFTER THE PROCEDURE   You will be taken to a recovery area where your progress will be monitored for 1-3 hours.  If the laparoscopic technique was used, you may be allowed to go home after several hours. You may have some shoulder pain after the laparoscopic procedure. This is normal and usually goes away in a day or two.  If the open technique  was used, you will be admitted to the hospital for a couple of days.  You will be given pain medicine if needed.  The IV access tube and catheter will be removed before you are discharged.   This information is not intended to replace advice given to you by your health care provider. Make sure you discuss any questions you have with your health care provider.   Document Released: 06/22/2008 Document Revised: 02/24/2014 Document Reviewed: 07/28/2012 Elsevier Interactive Patient Education 2016 [place]. Abdominal Hysterectomy Abdominal hysterectomy is a surgical procedure to remove your womb (uterus). Your uterus is the muscular organ that contains a developing baby. This surgery is done for many reasons. You may need an abdominal hysterectomy if you have cancer, growths (tumors), long-term pain, or bleeding. You may also have this procedure if your uterus has slipped down into your vagina (uterine prolapse). Depending on why you need an abdominal hysterectomy, you may also have other reproductive organs removed. These could include the part of your vagina that connects with your uterus (cervix), the  organs that make eggs (ovaries), and the tubes that connect the ovaries to the uterus (fallopian tubes). LET [facility] CARE PROVIDER KNOW ABOUT:   Any allergies you have.  All medicines you are taking, including vitamins, herbs, eye drops, creams, and over-the-counter medicines.  Previous problems you or members of your family have had with the use of anesthetics.  Any blood disorders you have.  Previous surgeries you have had.  Medical conditions you have. RISKS AND COMPLICATIONS Generally, this is a safe procedure. However, as with any procedure, problems can occur. Infection is the most common problem after an abdominal hysterectomy. Other possible problems include:  Bleeding.  Formation of blood clots that may break free and travel to your lungs.  Injury to other organs near  your uterus.  Nerve injury causing nerve pain.  Decreased interest in sex or pain during sexual intercourse. BEFORE THE PROCEDURE  Abdominal hysterectomy is a major surgical procedure. It can affect the way you feel about yourself. Talk to your health care provider about the physical and emotional changes hysterectomy may cause.  You may need to have blood work and X-rays done before surgery.  Quit smoking if you smoke. Ask your health care provider for help if you are struggling to quit.  Stop taking medicines that thin your blood as directed by your health care provider.  You may be instructed to take antibiotic medicines or laxatives before surgery.  Do not eat or drink anything for 6-8 hours before surgery.  Take your regular medicines with a small sip of water.  Bathe or shower the night or morning before surgery. PROCEDURE  Abdominal hysterectomy is done in the operating room at the hospital.  In most cases, you will be given a medicine that makes you go to sleep (general anesthetic).  The surgeon will make a cut (incision) through the skin in your lower belly.  The incision may be about 5-7 inches long. It may go side-to-side or up-and-down.  The surgeon will move aside the body tissue that covers your uterus. The surgeon will then carefully take out your uterus along with any of your other reproductive organs that need to be removed.  Bleeding will be controlled with clamps or sutures.  The surgeon will close your incision with sutures or metal clips. AFTER THE PROCEDURE  You will have some pain immediately after the procedure.  You will be given pain medicine in the recovery room.  You will be taken to your hospital room when you have recovered from the anesthesia.  You may need to stay in the hospital for 2-5 days.  You will be given instructions for recovery at home.   This information is not intended to replace advice given to you by your health care  provider. Make sure you discuss any questions you have with your health care provider.   Document Released: 02/08/2013 Document Reviewed: 02/08/2013 Elsevier Interactive Patient Education [employer].

## 2015-06-21 ENCOUNTER — Other Ambulatory Visit (HOSPITAL_COMMUNITY): Payer: BLUE CROSS/BLUE SHIELD

## 2015-06-21 NOTE — Pre-Procedure Instructions (Signed)
Spoke with Morrie SheldonAshley at Dr Rayna SextonFerguson's office requesting orders for PAT on 5/5

## 2015-06-22 ENCOUNTER — Encounter (HOSPITAL_COMMUNITY): Payer: Self-pay

## 2015-06-22 ENCOUNTER — Other Ambulatory Visit: Payer: Self-pay | Admitting: Obstetrics and Gynecology

## 2015-06-22 ENCOUNTER — Encounter (HOSPITAL_COMMUNITY)
Admission: RE | Admit: 2015-06-22 | Discharge: 2015-06-22 | Disposition: A | Discharge status: Home / Self Care | Payer: BLUE CROSS/BLUE SHIELD | Source: Ambulatory Visit | Attending: Obstetrics and Gynecology | Admitting: Obstetrics and Gynecology

## 2015-06-22 DIAGNOSIS — Z01812 Encounter for preprocedural laboratory examination: Secondary | ICD-10-CM | POA: Diagnosis present

## 2015-06-22 DIAGNOSIS — N941 Unspecified dyspareunia: Secondary | ICD-10-CM | POA: Diagnosis not present

## 2015-06-22 LAB — COMPREHENSIVE METABOLIC PANEL
ALT: 27 U/L (ref 14–54)
ANION GAP: 7 (ref 5–15)
AST: 19 U/L (ref 15–41)
Albumin: 3.9 g/dL (ref 3.5–5.0)
Alkaline Phosphatase: 138 U/L — ABNORMAL HIGH (ref 38–126)
BUN: 10 mg/dL (ref 6–20)
CALCIUM: 8.9 mg/dL (ref 8.9–10.3)
CHLORIDE: 104 mmol/L (ref 101–111)
CO2: 27 mmol/L (ref 22–32)
Creatinine, Ser: 0.58 mg/dL (ref 0.44–1.00)
GFR calc non Af Amer: >60 mL/min (ref >60)
Glucose, Bld: 88 mg/dL (ref 65–99)
Potassium: 4 mmol/L (ref 3.5–5.1)
SODIUM: 138 mmol/L (ref 135–145)
Total Bilirubin: 0.6 mg/dL (ref 0.3–1.2)
Total Protein: 7.4 g/dL (ref 6.5–8.1)

## 2015-06-22 LAB — TYPE AND SCREEN
ABO/RH(D): O POS
ANTIBODY SCREEN: NEGATIVE

## 2015-06-22 LAB — CBC
HEMATOCRIT: 39.8 % (ref 36.0–46.0)
Hemoglobin: 13 g/dL (ref 12.0–15.0)
MCH: 30 pg (ref 26.0–34.0)
MCHC: 32.7 g/dL (ref 30.0–36.0)
MCV: 91.7 fL (ref 78.0–100.0)
PLATELETS: 323 10*3/uL (ref 150–400)
RBC: 4.34 MIL/uL (ref 3.87–5.11)
RDW: 12.8 % (ref 11.5–15.5)
WBC: 6.5 10*3/uL (ref 4.0–10.5)

## 2015-06-22 LAB — URINALYSIS, ROUTINE W REFLEX MICROSCOPIC
Bilirubin Urine: NEGATIVE
Glucose, UA: NEGATIVE mg/dL
Hgb urine dipstick: NEGATIVE
Ketones, ur: NEGATIVE mg/dL
Leukocytes, UA: NEGATIVE
NITRITE: NEGATIVE
Protein, ur: NEGATIVE mg/dL
SPECIFIC GRAVITY, URINE: 1.01 (ref 1.005–1.030)
pH: 7 (ref 5.0–8.0)

## 2015-06-22 LAB — HCG, SERUM, QUALITATIVE: Preg, Serum: NEGATIVE

## 2015-06-22 NOTE — Pre-Procedure Instructions (Signed)
Patient requests that husband be the only person Dr Emelda FearFerguson talks to after surgery.

## 2015-06-26 ENCOUNTER — Encounter (HOSPITAL_COMMUNITY): Payer: Self-pay | Admitting: *Deleted

## 2015-06-26 ENCOUNTER — Inpatient Hospital Stay (HOSPITAL_COMMUNITY)
Admission: RE | Admit: 2015-06-26 | Discharge: 2015-06-28 | DRG: 743 | Disposition: A | Discharge status: Home / Self Care | Payer: BLUE CROSS/BLUE SHIELD | Source: Ambulatory Visit | Attending: Obstetrics and Gynecology | Admitting: Obstetrics and Gynecology

## 2015-06-26 ENCOUNTER — Inpatient Hospital Stay (HOSPITAL_COMMUNITY): Payer: BLUE CROSS/BLUE SHIELD | Admitting: Anesthesiology

## 2015-06-26 ENCOUNTER — Encounter (HOSPITAL_COMMUNITY)
Admission: RE | Disposition: A | Discharge status: Home / Self Care | Payer: Self-pay | Source: Ambulatory Visit | Attending: Obstetrics and Gynecology

## 2015-06-26 DIAGNOSIS — Z8249 Family history of ischemic heart disease and other diseases of the circulatory system: Secondary | ICD-10-CM

## 2015-06-26 DIAGNOSIS — N871 Moderate cervical dysplasia: Principal | ICD-10-CM | POA: Diagnosis present

## 2015-06-26 DIAGNOSIS — Z3049 Encounter for surveillance of other contraceptives: Secondary | ICD-10-CM | POA: Diagnosis not present

## 2015-06-26 DIAGNOSIS — R102 Pelvic and perineal pain: Secondary | ICD-10-CM | POA: Diagnosis not present

## 2015-06-26 DIAGNOSIS — Z833 Family history of diabetes mellitus: Secondary | ICD-10-CM | POA: Diagnosis not present

## 2015-06-26 DIAGNOSIS — Z6834 Body mass index (BMI) 34.0-34.9, adult: Secondary | ICD-10-CM | POA: Diagnosis not present

## 2015-06-26 DIAGNOSIS — N941 Unspecified dyspareunia: Secondary | ICD-10-CM | POA: Diagnosis present

## 2015-06-26 DIAGNOSIS — Z825 Family history of asthma and other chronic lower respiratory diseases: Secondary | ICD-10-CM | POA: Diagnosis not present

## 2015-06-26 DIAGNOSIS — Z90710 Acquired absence of both cervix and uterus: Secondary | ICD-10-CM | POA: Diagnosis present

## 2015-06-26 DIAGNOSIS — E669 Obesity, unspecified: Secondary | ICD-10-CM | POA: Diagnosis present

## 2015-06-26 DIAGNOSIS — N9412 Deep dyspareunia: Secondary | ICD-10-CM

## 2015-06-26 HISTORY — PX: BILATERAL SALPINGECTOMY: SHX5743

## 2015-06-26 HISTORY — PX: REMOVAL OF NON VAGINAL CONTRACEPTIVE DEVICE: SHX6219

## 2015-06-26 HISTORY — PX: ABDOMINAL HYSTERECTOMY: SHX81

## 2015-06-26 SURGERY — HYSTERECTOMY, ABDOMINAL
Anesthesia: General

## 2015-06-26 MED ORDER — ROCURONIUM BROMIDE 100 MG/10ML IV SOLN
INTRAVENOUS | Status: DC | PRN
  Administered 2015-06-26 (×3): 10 mg via INTRAVENOUS
  Administered 2015-06-26: 40 mg via INTRAVENOUS

## 2015-06-26 MED ORDER — FENTANYL CITRATE (PF) 100 MCG/2ML IJ SOLN
INTRAMUSCULAR | Status: AC
  Filled 2015-06-26: qty 2

## 2015-06-26 MED ORDER — LACTATED RINGERS IV SOLN
INTRAVENOUS | Status: DC
  Administered 2015-06-26: 09:00:00 via INTRAVENOUS
  Administered 2015-06-26: 1000 mL via INTRAVENOUS
  Administered 2015-06-26: 10:00:00 via INTRAVENOUS

## 2015-06-26 MED ORDER — GLYCOPYRROLATE 0.2 MG/ML IJ SOLN
INTRAMUSCULAR | Status: DC | PRN
  Administered 2015-06-26: .6 mg via INTRAVENOUS

## 2015-06-26 MED ORDER — NEOSTIGMINE METHYLSULFATE 10 MG/10ML IV SOLN
INTRAVENOUS | Status: AC
  Filled 2015-06-26: qty 1

## 2015-06-26 MED ORDER — MIDAZOLAM HCL 2 MG/2ML IJ SOLN
1.0000 mg | INTRAMUSCULAR | Status: DC | PRN
  Administered 2015-06-26: 2 mg via INTRAVENOUS

## 2015-06-26 MED ORDER — ROCURONIUM BROMIDE 50 MG/5ML IV SOLN
INTRAVENOUS | Status: AC
  Filled 2015-06-26: qty 1

## 2015-06-26 MED ORDER — GLYCOPYRROLATE 0.2 MG/ML IJ SOLN
INTRAMUSCULAR | Status: AC
  Filled 2015-06-26: qty 3

## 2015-06-26 MED ORDER — CEFAZOLIN SODIUM-DEXTROSE 2-4 GM/100ML-% IV SOLN
INTRAVENOUS | Status: AC
  Filled 2015-06-26: qty 100

## 2015-06-26 MED ORDER — PANTOPRAZOLE SODIUM 40 MG PO TBEC
40.0000 mg | DELAYED_RELEASE_TABLET | Freq: Every day | ORAL | Status: DC
  Administered 2015-06-27 – 2015-06-28 (×2): 40 mg via ORAL
  Filled 2015-06-26 (×2): qty 1

## 2015-06-26 MED ORDER — HYDROMORPHONE HCL 1 MG/ML IJ SOLN
INTRAMUSCULAR | Status: AC
  Filled 2015-06-26: qty 1

## 2015-06-26 MED ORDER — FENTANYL CITRATE (PF) 250 MCG/5ML IJ SOLN
INTRAMUSCULAR | Status: AC
  Filled 2015-06-26: qty 5

## 2015-06-26 MED ORDER — FENTANYL CITRATE (PF) 100 MCG/2ML IJ SOLN
25.0000 ug | INTRAMUSCULAR | Status: DC | PRN
  Administered 2015-06-26 (×2): 50 ug via INTRAVENOUS

## 2015-06-26 MED ORDER — HYDROMORPHONE HCL 1 MG/ML IJ SOLN
0.2500 mg | INTRAMUSCULAR | Status: DC | PRN
  Administered 2015-06-26 (×2): 0.5 mg via INTRAVENOUS

## 2015-06-26 MED ORDER — HYDROMORPHONE 1 MG/ML IV SOLN
INTRAVENOUS | Status: DC
  Administered 2015-06-26: 1.5 mg via INTRAVENOUS
  Administered 2015-06-26: 11:00:00 via INTRAVENOUS
  Administered 2015-06-26: 1.2 mg via INTRAVENOUS

## 2015-06-26 MED ORDER — PROPOFOL 10 MG/ML IV BOLUS
INTRAVENOUS | Status: AC
  Filled 2015-06-26: qty 20

## 2015-06-26 MED ORDER — LIDOCAINE HCL 1 % IJ SOLN
INTRAMUSCULAR | Status: DC | PRN
  Administered 2015-06-26: 50 mg via INTRADERMAL

## 2015-06-26 MED ORDER — SODIUM CHLORIDE 0.9% FLUSH: 9.0000 mL | INTRAVENOUS | Status: DC | PRN

## 2015-06-26 MED ORDER — ONDANSETRON HCL 4 MG/2ML IJ SOLN
INTRAMUSCULAR | Status: AC
  Filled 2015-06-26: qty 2

## 2015-06-26 MED ORDER — BUPIVACAINE HCL (PF) 0.5 % IJ SOLN
INTRAMUSCULAR | Status: AC
  Filled 2015-06-26: qty 30

## 2015-06-26 MED ORDER — KETOROLAC TROMETHAMINE 30 MG/ML IJ SOLN
INTRAMUSCULAR | Status: AC
  Filled 2015-06-26: qty 1

## 2015-06-26 MED ORDER — ONDANSETRON HCL 4 MG/2ML IJ SOLN
4.0000 mg | Freq: Four times a day (QID) | INTRAMUSCULAR | Status: DC | PRN
  Filled 2015-06-26: qty 2

## 2015-06-26 MED ORDER — SODIUM CHLORIDE 0.9 % IV SOLN
INTRAVENOUS | Status: DC
Start: 2015-06-26 — End: 2015-06-27
  Administered 2015-06-26 – 2015-06-27 (×3): via INTRAVENOUS

## 2015-06-26 MED ORDER — HYDROMORPHONE HCL 1 MG/ML IJ SOLN
1.0000 mg | INTRAMUSCULAR | Status: DC | PRN
  Administered 2015-06-27 – 2015-06-28 (×5): 1 mg via INTRAVENOUS
  Filled 2015-06-26 (×5): qty 1

## 2015-06-26 MED ORDER — DIPHENHYDRAMINE HCL 12.5 MG/5ML PO ELIX: 12.5000 mg | ORAL_SOLUTION | Freq: Four times a day (QID) | ORAL | Status: DC | PRN

## 2015-06-26 MED ORDER — NALOXONE HCL 0.4 MG/ML IJ SOLN: 0.4000 mg | INTRAMUSCULAR | Status: DC | PRN

## 2015-06-26 MED ORDER — CEFAZOLIN SODIUM-DEXTROSE 2-4 GM/100ML-% IV SOLN
2.0000 g | Freq: Once | INTRAVENOUS | Status: AC
  Administered 2015-06-26: 2 g via INTRAVENOUS

## 2015-06-26 MED ORDER — ONDANSETRON HCL 4 MG PO TABS: 4.0000 mg | ORAL_TABLET | Freq: Four times a day (QID) | ORAL | Status: DC | PRN

## 2015-06-26 MED ORDER — ONDANSETRON HCL 4 MG/2ML IJ SOLN
INTRAMUSCULAR | Status: DC | PRN
  Administered 2015-06-26: 4 mg via INTRAVENOUS

## 2015-06-26 MED ORDER — DIPHENHYDRAMINE HCL 50 MG/ML IJ SOLN: 12.5000 mg | Freq: Four times a day (QID) | INTRAMUSCULAR | Status: DC | PRN

## 2015-06-26 MED ORDER — NEOSTIGMINE METHYLSULFATE 10 MG/10ML IV SOLN
INTRAVENOUS | Status: DC | PRN
  Administered 2015-06-26: 3 mg via INTRAVENOUS

## 2015-06-26 MED ORDER — KETOROLAC TROMETHAMINE 30 MG/ML IJ SOLN: 30.0000 mg | Freq: Four times a day (QID) | INTRAMUSCULAR | Status: DC

## 2015-06-26 MED ORDER — PROPOFOL 10 MG/ML IV BOLUS
INTRAVENOUS | Status: DC | PRN
  Administered 2015-06-26: 120 mg via INTRAVENOUS

## 2015-06-26 MED ORDER — KETOROLAC TROMETHAMINE 30 MG/ML IJ SOLN
30.0000 mg | Freq: Once | INTRAMUSCULAR | Status: AC
  Administered 2015-06-26: 30 mg via INTRAVENOUS

## 2015-06-26 MED ORDER — KETOROLAC TROMETHAMINE 30 MG/ML IJ SOLN
30.0000 mg | Freq: Four times a day (QID) | INTRAMUSCULAR | Status: DC
  Administered 2015-06-26 – 2015-06-28 (×7): 30 mg via INTRAVENOUS
  Filled 2015-06-26 (×7): qty 1

## 2015-06-26 MED ORDER — FENTANYL CITRATE (PF) 250 MCG/5ML IJ SOLN
INTRAMUSCULAR | Status: DC | PRN
  Administered 2015-06-26 (×7): 50 ug via INTRAVENOUS

## 2015-06-26 MED ORDER — OXYCODONE-ACETAMINOPHEN 5-325 MG PO TABS
1.0000 | ORAL_TABLET | ORAL | Status: DC | PRN
  Administered 2015-06-26 – 2015-06-28 (×5): 2 via ORAL
  Filled 2015-06-26 (×5): qty 2

## 2015-06-26 MED ORDER — HYDROMORPHONE 1 MG/ML IV SOLN
INTRAVENOUS | Status: AC
  Filled 2015-06-26: qty 25

## 2015-06-26 MED ORDER — DEXTROSE 5 % IV SOLN: 2000.0000 mg | Freq: Once | INTRAVENOUS | Status: DC

## 2015-06-26 MED ORDER — ONDANSETRON HCL 4 MG/2ML IJ SOLN
4.0000 mg | Freq: Once | INTRAMUSCULAR | Status: AC | PRN
  Administered 2015-06-26: 4 mg via INTRAVENOUS

## 2015-06-26 MED ORDER — ONDANSETRON HCL 4 MG/2ML IJ SOLN
4.0000 mg | Freq: Four times a day (QID) | INTRAMUSCULAR | Status: DC | PRN
  Administered 2015-06-26 – 2015-06-27 (×2): 4 mg via INTRAVENOUS
  Filled 2015-06-26: qty 2

## 2015-06-26 MED ORDER — IBUPROFEN 600 MG PO TABS: 600.0000 mg | ORAL_TABLET | Freq: Four times a day (QID) | ORAL | Status: DC | PRN

## 2015-06-26 MED ORDER — BUPIVACAINE HCL (PF) 0.5 % IJ SOLN
INTRAMUSCULAR | Status: DC | PRN
  Administered 2015-06-26: 10 mL

## 2015-06-26 MED ORDER — MIDAZOLAM HCL 2 MG/2ML IJ SOLN
INTRAMUSCULAR | Status: AC
  Filled 2015-06-26: qty 2

## 2015-06-26 MED ORDER — LIDOCAINE HCL (PF) 1 % IJ SOLN
INTRAMUSCULAR | Status: AC
  Filled 2015-06-26: qty 5

## 2015-06-26 MED ORDER — 0.9 % SODIUM CHLORIDE (POUR BTL) OPTIME
TOPICAL | Status: DC | PRN
  Administered 2015-06-26: 2000 mL

## 2015-06-26 SURGICAL SUPPLY — 68 items
APL SKNCLS STERI-STRIP NONHPOA (GAUZE/BANDAGES/DRESSINGS) ×4
APPLIER CLIP 11 MED OPEN (CLIP)
APPLIER CLIP 13 LRG OPEN (CLIP)
APR CLP LRG 13 20 CLIP (CLIP)
APR CLP MED 11 20 MLT OPN (CLIP)
BAG HAMPER (MISCELLANEOUS) ×4 IMPLANT
BAG URINE DRAINAGE (UROLOGICAL SUPPLIES) ×2 IMPLANT
BENZOIN TINCTURE PRP APPL 2/3 (GAUZE/BANDAGES/DRESSINGS) ×4 IMPLANT
CATH FOLEY LATEX FREE 16FR (CATHETERS) ×4
CATH FOLEY LF 16FR (CATHETERS) IMPLANT
CELLS DAT CNTRL 66122 CELL SVR (MISCELLANEOUS) ×2 IMPLANT
CLIP APPLIE 11 MED OPEN (CLIP) IMPLANT
CLIP APPLIE 13 LRG OPEN (CLIP) IMPLANT
CLOSURE WOUND 1/2 X4 (GAUZE/BANDAGES/DRESSINGS) ×2
CLOSURE WOUND 1/4 X3 (GAUZE/BANDAGES/DRESSINGS) ×1
CLOTH BEACON ORANGE TIMEOUT ST (SAFETY) ×4 IMPLANT
COVER LIGHT HANDLE STERIS (MISCELLANEOUS) ×8 IMPLANT
DRAPE WARM FLUID 44X44 (DRAPE) ×4 IMPLANT
DRESSING COVERLET 3X1 FLEXIBLE (GAUZE/BANDAGES/DRESSINGS) ×2 IMPLANT
DRSG OPSITE POSTOP 4X10 (GAUZE/BANDAGES/DRESSINGS) ×2 IMPLANT
DRSG OPSITE POSTOP 4X8 (GAUZE/BANDAGES/DRESSINGS) IMPLANT
DRSG TEGADERM 2-3/8X2-3/4 SM (GAUZE/BANDAGES/DRESSINGS) ×2 IMPLANT
DURAPREP 26ML APPLICATOR (WOUND CARE) ×4 IMPLANT
ELECT REM PT RETURN 9FT ADLT (ELECTROSURGICAL) ×4
ELECTRODE REM PT RTRN 9FT ADLT (ELECTROSURGICAL) ×2 IMPLANT
EVACUATOR DRAINAGE 10X20 100CC (DRAIN) IMPLANT
EVACUATOR SILICONE 100CC (DRAIN)
FORMALIN 10 PREFIL 480ML (MISCELLANEOUS) ×4 IMPLANT
GLOVE BIOGEL PI IND STRL 7.0 (GLOVE) ×2 IMPLANT
GLOVE BIOGEL PI IND STRL 9 (GLOVE) ×2 IMPLANT
GLOVE BIOGEL PI INDICATOR 7.0 (GLOVE) ×10
GLOVE BIOGEL PI INDICATOR 9 (GLOVE) ×2
GLOVE ECLIPSE 9.0 STRL (GLOVE) ×2 IMPLANT
GOWN SPEC L3 XXLG W/TWL (GOWN DISPOSABLE) ×4 IMPLANT
GOWN STRL REUS W/TWL LRG LVL3 (GOWN DISPOSABLE) ×8 IMPLANT
INST SET MAJOR GENERAL (KITS) ×4 IMPLANT
KIT ROOM TURNOVER APOR (KITS) ×4 IMPLANT
MANIFOLD NEPTUNE II (INSTRUMENTS) ×4 IMPLANT
NDL HYPO 25X1 1.5 SAFETY (NEEDLE) ×2 IMPLANT
NEEDLE HYPO 25X1 1.5 SAFETY (NEEDLE) ×4 IMPLANT
NS IRRIG 1000ML POUR BTL (IV SOLUTION) ×8 IMPLANT
PACK ABDOMINAL MAJOR (CUSTOM PROCEDURE TRAY) ×4 IMPLANT
PAD ARMBOARD 7.5X6 YLW CONV (MISCELLANEOUS) ×4 IMPLANT
RETRACTOR WND ALEXIS 18 MED (MISCELLANEOUS) IMPLANT
RETRACTOR WND ALEXIS 25 LRG (MISCELLANEOUS) IMPLANT
RTRCTR WOUND ALEXIS 18CM MED (MISCELLANEOUS) ×4
RTRCTR WOUND ALEXIS 25CM LRG (MISCELLANEOUS)
SET BASIN LINEN APH (SET/KITS/TRAYS/PACK) ×4 IMPLANT
SPONGE DRAIN TRACH 4X4 STRL 2S (GAUZE/BANDAGES/DRESSINGS) IMPLANT
SPONGE LAP 18X18 X RAY DECT (DISPOSABLE) IMPLANT
STRIP CLOSURE SKIN 1/2X4 (GAUZE/BANDAGES/DRESSINGS) ×6 IMPLANT
STRIP CLOSURE SKIN 1/4X3 (GAUZE/BANDAGES/DRESSINGS) ×1 IMPLANT
SUT CHROMIC 0 CT 1 (SUTURE) ×44 IMPLANT
SUT CHROMIC 2 0 CT 1 (SUTURE) ×6 IMPLANT
SUT CHROMIC GUT BROWN 0 54 (SUTURE) IMPLANT
SUT CHROMIC GUT BROWN 0 54IN (SUTURE)
SUT ETHILON 3 0 FSL (SUTURE) IMPLANT
SUT PDS AB CT VIOLET #0 27IN (SUTURE) IMPLANT
SUT PLAIN CT 1/2CIR 2-0 27IN (SUTURE) ×6 IMPLANT
SUT PROLENE 0 CT 1 30 (SUTURE) IMPLANT
SUT VIC AB 0 CT1 27 (SUTURE) ×4
SUT VIC AB 0 CT1 27XBRD ANTBC (SUTURE) IMPLANT
SUT VIC AB 2-0 CT1 27 (SUTURE)
SUT VIC AB 2-0 CT1 TAPERPNT 27 (SUTURE) IMPLANT
SUT VICRYL 4 0 KS 27 (SUTURE) ×4 IMPLANT
SYR CONTROL 10ML LL (SYRINGE) ×4 IMPLANT
TOWEL BLUE STERILE X RAY DET (MISCELLANEOUS) ×4 IMPLANT
TRAY FOLEY CATH SILVER 16FR (SET/KITS/TRAYS/PACK) ×2 IMPLANT

## 2015-06-26 NOTE — Anesthesia Postprocedure Evaluation (Signed)
Anesthesia Post Note  Patient: [name]  Procedure(s) Performed: Procedure(s) (LRB): HYSTERECTOMY ABDOMINAL (N/A) BILATERAL SALPINGECTOMY (Bilateral) Nexplanon Removal (N/A)  Patient location during evaluation: PACU Anesthesia Type: General Level of consciousness: awake and alert and oriented Pain management: pain level controlled Vital Signs Assessment: post-procedure vital signs reviewed and stable Respiratory status: spontaneous breathing Cardiovascular status: blood pressure returned to baseline Postop Assessment: no signs of nausea or vomiting Anesthetic complications: no    Last Vitals:  Filed Vitals:   06/26/15 1128 06/26/15 1131  BP:  126/73  Pulse:  60  Temp:  36.4 C  Resp: 16 16    Last Pain:  Filed Vitals:   06/26/15 1138  PainSc: 7                  Andrian Urbach

## 2015-06-26 NOTE — Progress Notes (Signed)
Belongings at bedside

## 2015-06-26 NOTE — Brief Op Note (Signed)
06/26/2015  10:08 AM  PATIENT:  [name]  30 y.o. female  PRE-OPERATIVE DIAGNOSIS:  dyspareunia pelvic pain cervical dysplasia ASCUS  POST-OPERATIVE DIAGNOSIS:  dyspareunia pelvic pain cervical dysplasia ASCUS  PROCEDURE:  Procedure(s) with comments: HYSTERECTOMY ABDOMINAL (N/A) - procedure 1 BILATERAL SALPINGECTOMY (Bilateral) Nexplanon Removal (N/A)  SURGEON:  Surgeon(s) and Role:    * Tilda Burrow, MD - Primary  PHYSICIAN ASSISTANT:   ASSISTANTS: Catherine page RNFA   ANESTHESIA:   local and general  EBL:  Total I/O In: 2000 [I.V.:2000] Out: 225 [Urine:75; Blood:150]  BLOOD ADMINISTERED:none  DRAINS: Urinary Catheter (Foley)   LOCAL MEDICATIONS USED:  MARCAINE    and Amount: 10 ml  SPECIMEN:  Source of Specimen:  Uterus and cervix, bilateral fallopian tubes  DISPOSITION OF SPECIMEN:  PATHOLOGY  COUNTS:  YES  TOURNIQUET:  * No tourniquets in log *  DICTATION: .Dragon Dictation  PLAN OF CARE: Admit to inpatient   PATIENT DISPOSITION:  PACU - hemodynamically stable.   Delay start of Pharmacological VTE agent (>24hrs) due to surgical blood loss or risk of bleeding: not applicable Details of procedure: Patient was taken operating room prepped and draped for lower abdominal procedure. Cicatrix was excised with some subcutaneous fatty tissue beneath it to allow for improved access for Pfannenstiel type opening of the abdominal cavity performed there were omental adhesions to the previous laparotomy site and this was transected with some small portion of omentum left on the anterior abdominal wall. Alexis retractor was positioned in place, and bowel packed away. The uterus was small anterior and mobile. There was a hemorrhagic cyst on the right ovary considered normal round ligaments were clamped cut and sutured ligated bilaterally, and bladder flap developed anteriorly. There were couple of blood vessels that required individual careful point cautery. The  utero-ovarian ligament could be isolated clamped cut and suture ligated on each side with 0 chromic used to suture and Kelly clamps. Uterine vessels were then skeletonized crossclamped with curved Heaney clamp with Kelly clamp place for backbleeding, with vessels transected and suture ligated with 0 chromic the upper and lower cardinal ligaments were then clamped with straight Heaney clamp knife transection and 0 chromic suture ligature performed as time a stab incision could be made in the anterior cervicovaginal fornix and the cervix amputated off the vaginal cuff. The cuff was rather vascular on the left side managed by 4:00 or clamp cervix and its is at tear posterior and 8 at each lateral ankle with Aldridge stitches placed at the lateral vaginal angle held for orientation, and a figure-of-eight suture placed posteriorly to reduce vaginal apex diameter. The rest of the cuff was closed with a series of interrupted sutures front to back with good tissue its approximation. The left uterine artery required oversewing due to oozing from the uterine artery. Once hemostasis was achieved pelvis was irrigated, and the anterior peritoneum along the bladder is attached to the posterior vaginal cuff with 2 interrupted sutures of 2-0 chromic remainder of the pelvis was considered hemostatic. Laparotomy equipment was removed. The anterior peritoneum was closed in a running 2-0 chromic, and then the fascia closed with running 0 Vicryl. Subcutaneous tissues were irrigated confirmed as hemostatic, reapproximated with 2 layers of interrupted horizontal mattress sutures of 2-0 plain, followed by subcuticular 4-0 Vicryl closure of the skin incisions. Steri-Strips were applied and the dressing placed in position. Nexplanon removal. At this time after prepping and draping the left arm and confirming the surgical procedure again, the Nexplanon  could be identified in the skin just beneath the skin, on the left under arm. This was  taken care of by making a small 4 mm incision the distal tip of the Nexplanon, identifying the tip with a hemostat and extracting the Nexplanon in its entirety, then placing Steri-Strips over the incision and a Band-Aid. Sponge and needle counts were correct patient to recovery room in good condition.

## 2015-06-26 NOTE — Anesthesia Preprocedure Evaluation (Signed)
Anesthesia Evaluation  Patient identified by MRN, date of birth, ID band Patient awake    Reviewed: Allergy & Precautions, NPO status , Patient's Chart, lab work & pertinent test results  Airway Mallampati: II  TM Distance: >3 FB Neck ROM: Full    Dental  (+) Missing, Dental Advisory Given, Caps,    Pulmonary    Pulmonary exam normal        Cardiovascular Normal cardiovascular exam     Neuro/Psych  Headaches,    GI/Hepatic   Endo/Other    Renal/GU      Musculoskeletal   Abdominal (+) + obese,   Peds  Hematology   Anesthesia Other Findings   Reproductive/Obstetrics                             Anesthesia Physical Anesthesia Plan  ASA: III  Anesthesia Plan: General   Post-op Pain Management:    Induction: Intravenous  Airway Management Planned: Oral ETT  Additional Equipment:   Intra-op Plan:   Post-operative Plan: Extubation in OR  Informed Consent: I have reviewed the patients History and Physical, chart, labs and discussed the procedure including the risks, benefits and alternatives for the proposed anesthesia with the patient or authorized representative who has indicated his/her understanding and acceptance.   Dental advisory given  Plan Discussed with: CRNA  Anesthesia Plan Comments:         Anesthesia Quick Evaluation

## 2015-06-26 NOTE — H&P (Signed)
Related encounter: Procedure visit from 06/06/2015 in [facility]    Expand All Collapse All   Patient ID: [name], female DOB: 1986-01-14, 30 y.o. MRN: [id number]   Preoperative History and Physical  [name] is a 30 y.o. [phone] here for surgical management of dyspareunia and pelvic pain, as well as ASCUS on pap smear. No significant preoperative concerns.  Patient had originally been scheduled for a colposcopy to evaluate her ASCUS results; however, as she was already considering a hysterectomy, we will do a pre-op visit today.  She denies any current URI-like symptoms.  Patient states she has an Implanon currently in her arm, to be removed during the surgery.   Proposed surgery: Abdominal hysterectomy with removal of cervix, and bilateral salpingectomy, sparing the ovaries   Past Medical History  Diagnosis Date  . No pertinent past medical history   . Incompetent cervix in pregnancy   . Abnormal Pap smear   . Hx of chlamydia infection   . Hx of gonorrhea   . Rectal bleeding 09/15/2013  . Left sided abdominal pain 09/15/2013  . Positive fecal occult blood test 09/15/2013  . Vaginal Pap smear, abnormal    Past Surgical History  Procedure Laterality Date  . Tonsillectomy    . Cervical biopsy    . Breast surgery    . Laparoscopy for ectopic pregnancy  03/10/2011    Left Linear Salpingostomy,   . Dilation and curettage of uterus    . Cervical cerclage N/A 2006  . Ectopic pregnancy surgery Left 2013  . Cesarean section N/A 05/16/2012    Procedure: Primary cesarean section with delivery of baby girl at 1144. Apgars 8/9.; Surgeon: Lesly Dukes, MD; Location: [facility]; Service: Obstetrics; Laterality: N/A;   OB History  Gravida Para Term Preterm AB SAB TAB Ectopic Multiple Living  # Outcome Date GA Lbr Len/2nd Weight Sex  Delivery Anes PTL Lv  9 Preterm 05/16/12 [redacted]w[redacted]d  4 lb 13.4 oz (2.195 kg) F CS-LTranv Spinal  Y   Comments: none  8 Ectopic 2013           Comments: Left ectopic  7 Preterm 06/03/04 [redacted]w[redacted]d   F Vag-Spont   Y   Comments: Had cerclage  6 Preterm 11/04/02 [redacted]w[redacted]d   F Vag-Spont None  Y   Comments: Incompetent cervix  5 SAB           4 SAB           3 SAB           2 SAB           1 SAB             Patient denies any other pertinent gynecologic issues.   Current Outpatient Prescriptions on File Prior to Visit  Medication Sig Dispense Refill  . acetaminophen (TYLENOL) 500 MG tablet Take 500 mg by mouth every 6 (six) hours as needed for pain.    Marland Kitchen etonogestrel (NEXPLANON) 68 MG IMPL implant Inject 1 each into the skin once.     No current facility-administered medications on file prior to visit.   Allergies  Allergen Reactions  . Alprazolam     UNKNOWN REACTION: Placed in Hospital  . Latex     Contraceptive    Social History:  reports that she has never smoked. She has never used smokeless tobacco. She reports that she drinks alcohol.  She reports that she does not use illicit drugs.  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  Family History  Problem Relation Age of Onset  . Endometriosis Other   . Diabetes Other   . Hypertension Other   . Cancer Other     colon and ovarian   . Asthma Sister   . Allergies Sister   . Eczema Sister   . Asthma Brother   . Allergies Brother   . Eczema Brother   . Asthma Daughter   . Allergies Daughter   . Eczema Daughter   . Thyroid disease Maternal Grandmother     had thyroid removed  . Cancer Maternal Grandfather     lung  . Cancer Paternal Grandmother     ovarian,stomach  . Cancer  Paternal Grandfather     prostate  . Allergies Daughter   . Eczema Daughter     Review of Systems: Noncontributory  PHYSICAL EXAM: Blood pressure 130/76, height 5\' 7"  (1.702 m), weight 212 lb (96.163 kg). General appearance - alert, well appearing, and in no distress Chest - clear to auscultation, no wheezes, rales or rhonchi, symmetric air entry Heart - normal rate and regular rhythm Abdomen - soft, nontender, nondistended, no masses or organomegaly  Pelvic - examination  Pelvic exam: normal external genitalia, vulva, vagina, cervix, uterus and adnexa,  VULVA: normal appearing vulva with no masses, tenderness or lesions,  VAGINA: normal appearing vagina with normal color and discharge, no lesions,  CERVIX: normal appearing cervix without discharge or lesions, well-supported, tender to touch, tolerated poorly by patient UTERUS: uterus is sensitive to touch, tolerated poorly by patient, but normal size, shape, consistency and nontender ADNEXA: normal adnexa in size, nontender and no masses.  Extremities - peripheral pulses normal, no pedal edema, no clubbing or cyanosis  Labs: No results found for this or any previous visit (from the past 336 hour(s)).  Imaging Studies:  Imaging Results    No results found.    Assessment: Patient Active Problem List   Diagnosis Date Noted  . Headache associated with sexual activity 03/20/2014  . Pelvic pain in female 03/20/2014  . Coital headache 02/24/2014  . Pelvic pain affecting pregnancy 02/24/2014  . Rectal bleeding 09/15/2013  . Left sided abdominal pain 09/15/2013  . Positive fecal occult blood test 09/15/2013  . Hx of abnormal Pap smear 06/17/2012    Plan: Patient will undergo surgical management with abdominal hysterectomy with removal of cervix, and bilateral salpingectomy, sparing the ovaries. Also Nexplanon removal from the left upper arm.

## 2015-06-26 NOTE — Anesthesia Procedure Notes (Signed)
Procedure Name: Intubation Date/Time: 06/26/2015 7:46 AM Performed by: Glynn OctaveANIEL, Tionne Carelli E Pre-anesthesia Checklist: Patient identified, Patient being monitored, Timeout performed, Emergency Drugs available and Suction available Patient Re-evaluated:Patient Re-evaluated prior to inductionOxygen Delivery Method: Circle System Utilized Preoxygenation: Pre-oxygenation with 100% oxygen Intubation Type: IV induction Ventilation: Mask ventilation without difficulty Laryngoscope Size: Mac and 3 Grade View: Grade I Tube type: Oral Tube size: 7.0 mm Number of attempts: 1 Airway Equipment and Method: Stylet Placement Confirmation: ETT inserted through vocal cords under direct vision,  positive ETCO2 and breath sounds checked- equal and bilateral Secured at: 22 cm Tube secured with: Tape Dental Injury: Teeth and Oropharynx as per pre-operative assessment

## 2015-06-26 NOTE — Transfer of Care (Signed)
Immediate Anesthesia Transfer of Care Note  Patient: [name]  Procedure(s) Performed: Procedure(s) with comments: HYSTERECTOMY ABDOMINAL (N/A) - procedure 1 BILATERAL SALPINGECTOMY (Bilateral) Nexplanon Removal (N/A)  Patient Location: PACU  Anesthesia Type:General  Level of Consciousness: awake  Airway & Oxygen Therapy: Patient Spontanous Breathing and Patient connected to face mask oxygen  Post-op Assessment: Report given to RN and Post -op Vital signs reviewed and stable  Post vital signs: Reviewed and stable  Last Vitals:  Filed Vitals:   06/26/15 0725 06/26/15 0730  BP: 114/75 124/77  Temp:    Resp: 24 20    Last Pain: There were no vitals filed for this visit.    Patients Stated Pain Goal: 7 (06/26/15 [id number])  Complications: No apparent anesthesia complications

## 2015-06-26 NOTE — Op Note (Signed)
Please see the brief operative note for surgical details 

## 2015-06-27 ENCOUNTER — Encounter (HOSPITAL_COMMUNITY): Payer: Self-pay | Admitting: Obstetrics and Gynecology

## 2015-06-27 LAB — CBC
HEMATOCRIT: 35.1 % — AB (ref 36.0–46.0)
Hemoglobin: 11.5 g/dL — ABNORMAL LOW (ref 12.0–15.0)
MCH: 30.3 pg (ref 26.0–34.0)
MCHC: 32.8 g/dL (ref 30.0–36.0)
MCV: 92.6 fL (ref 78.0–100.0)
Platelets: 271 10*3/uL (ref 150–400)
RBC: 3.79 MIL/uL — ABNORMAL LOW (ref 3.87–5.11)
RDW: 13.1 % (ref 11.5–15.5)
WBC: 10.1 10*3/uL (ref 4.0–10.5)

## 2015-06-27 LAB — BASIC METABOLIC PANEL
Anion gap: 8 (ref 5–15)
BUN: 5 mg/dL — AB (ref 6–20)
CALCIUM: 8.2 mg/dL — AB (ref 8.9–10.3)
CHLORIDE: 105 mmol/L (ref 101–111)
CO2: 26 mmol/L (ref 22–32)
CREATININE: 0.68 mg/dL (ref 0.44–1.00)
GFR calc non Af Amer: >60 mL/min (ref >60)
GLUCOSE: 110 mg/dL — AB (ref 65–99)
Potassium: 3.7 mmol/L (ref 3.5–5.1)
Sodium: 139 mmol/L (ref 135–145)

## 2015-06-27 MED ORDER — DOCUSATE SODIUM 100 MG PO CAPS
100.0000 mg | ORAL_CAPSULE | Freq: Every day | ORAL | Status: DC | PRN
  Administered 2015-06-27 – 2015-06-28 (×2): 100 mg via ORAL
  Filled 2015-06-27 (×2): qty 1

## 2015-06-27 NOTE — Care Management Note (Signed)
Case Management Note  Patient Details  Name: [name] MRN: [id number] Date of Birth: 09/06/1985  Subjective/Objective:                  Pt is from home and is ind with ADL's. Pt has insurance, PCP, transportation and no difficulty affording medications. Pt will f/u with GYN.  Action/Plan: No CM needs.   Expected Discharge Date:  06/28/15               Expected Discharge Plan:  Home/Self Care  In-House Referral:  NA  Discharge planning Services  CM Consult  Post Acute Care Choice:  NA Choice offered to:  NA  DME Arranged:    DME Agency:     HH Arranged:    HH Agency:     Status of Service:  Completed, signed off  Medicare Important Message Given:    Date Medicare IM Given:    Medicare IM give by:    Date Additional Medicare IM Given:    Additional Medicare Important Message give by:     If discussed at Long Length of Stay Meetings, dates discussed:    Additional Comments:  Malcolm MetroChildress, Raven Furnas Demske, RN 06/27/2015, 11:20 AM

## 2015-06-27 NOTE — Progress Notes (Addendum)
1 Day Post-Op Procedure(s) (LRB): HYSTERECTOMY ABDOMINAL (N/A) BILATERAL SALPINGECTOMY (Bilateral) Nexplanon Removal (N/A)  Subjective: Patient reports incisional pain, tolerating PO and no problems voiding.    Objective: I have reviewed patient's vital signs and medications.  General: alert, cooperative and no distress Resp: clear to auscultation bilaterally GI: soft, non-tender; bowel sounds normal; no masses,  no organomegaly Extremities: extremities normal, atraumatic, no cyanosis or edema and Homans sign is negative, no sign of DVT Incision dressing slight ooze will replace the honeycomb dressing today CBC Latest Ref Rng 06/27/2015 06/22/2015 10/01/2014  WBC 4.0 - 10.5 K/uL 10.1 6.5 9.6  Hemoglobin 12.0 - 15.0 g/dL 11.5(L) 13.0 14.6  Hematocrit 36.0 - 46.0 % 35.1(L) 39.8 43.7  Platelets 150 - 400 K/uL 271 323 343   BMP Latest Ref Rng 06/27/2015 06/22/2015 10/01/2014  Glucose 65 - 99 mg/dL 161(W110(H) 88 960(A113(H)  BUN 6 - 20 mg/dL 5(L) 10 9  Creatinine 5.400.44 - 1.00 mg/dL 9.810.68 1.910.58 4.780.64  Sodium 135 - 145 mmol/L 139 138 139  Potassium 3.5 - 5.1 mmol/L 3.7 4.0 3.7  Chloride 101 - 111 mmol/L 105 104 102  CO2 22 - 32 mmol/L 26 27 28   Calcium 8.9 - 10.3 mg/dL 8.2(L) 8.9 9.9      Assessment: s/p Procedure(s) with comments: HYSTERECTOMY ABDOMINAL (N/A) - procedure 1 BILATERAL SALPINGECTOMY (Bilateral) Nexplanon Removal (N/A): stable  Plan: Advance diet Encourage ambulation Advance to PO medication Discontinue IV fluids Discharge home in a.m.  LOS: 1 day    Thanvi Blincoe V 06/27/2015, 8:35 AM

## 2015-06-27 NOTE — Progress Notes (Signed)
Pt foley removed. Pt ambulated in halls 100 ft. Pt tolerated well. Pt back in bed resting.

## 2015-06-27 NOTE — Anesthesia Postprocedure Evaluation (Signed)
Anesthesia Post Note  Patient: Tracy GripBonnie L Haxton  Procedure(s) Performed: Procedure(s) (LRB): HYSTERECTOMY ABDOMINAL (N/A) BILATERAL SALPINGECTOMY (Bilateral) Nexplanon Removal (N/A)  Patient location during evaluation: Nursing Unit Anesthesia Type: General Level of consciousness: awake and alert, oriented and patient cooperative Pain management: satisfactory to patient Vital Signs Assessment: post-procedure vital signs reviewed and stable Respiratory status: spontaneous breathing, nonlabored ventilation and respiratory function stable Cardiovascular status: blood pressure returned to baseline and stable Postop Assessment: no signs of nausea or vomiting and adequate PO intake Anesthetic complications: no    Last Vitals:  Filed Vitals:   06/26/15 2000 06/27/15 0645  BP: 127/74 105/56  Pulse: 63 64  Temp: 36.7 C 36.6 C  Resp: 14 20    Last Pain:  Filed Vitals:   06/27/15 0906  PainSc: 6                  Thamas Appleyard J

## 2015-06-27 NOTE — Addendum Note (Signed)
Addendum  created 06/27/15 1036 by Despina Hiddenobert J Desha Bitner, CRNA   Modules edited: Clinical Notes   Clinical Notes:  File: [id number]

## 2015-06-28 ENCOUNTER — Other Ambulatory Visit: Payer: Self-pay | Admitting: Adult Health

## 2015-06-28 ENCOUNTER — Telehealth: Payer: Self-pay | Admitting: Adult Health

## 2015-06-28 MED ORDER — OXYCODONE-ACETAMINOPHEN 5-325 MG PO TABS: 1.0000 | ORAL_TABLET | ORAL | Status: DC | PRN

## 2015-06-28 MED ORDER — DIPHENHYDRAMINE HCL 25 MG PO CAPS
25.0000 mg | ORAL_CAPSULE | Freq: Four times a day (QID) | ORAL | Status: DC | PRN
  Administered 2015-06-28: 25 mg via ORAL
  Filled 2015-06-28: qty 1

## 2015-06-28 NOTE — Progress Notes (Signed)
IV removed. Discharge instructions reviewed with patient. Awaiting dressing supplies. Will change dressing and patietn will be ready for discharge.

## 2015-06-28 NOTE — Telephone Encounter (Signed)
Dr Emelda FearFerguson called and ask that I give Kendal HymenBonnie rx for 20 percocet, has he is out of town this morning and she is going home from hospital from having surgery.

## 2015-06-28 NOTE — Discharge Summary (Signed)
Physician Discharge Summary  Patient ID: [name] MRN: [id number] DOB/AGE: 31/22/1987 31 y.o.  Admit date: 06/26/2015 Discharge date: 06/28/2015  Admission Diagnoses:dyspareunia,  CIN II, pelvic pain  Discharge Diagnoses: same Active Problems:   Status post hysterectomy   Discharged Condition: good  Hospital Course: Admitted through outpatient for abdominal hysterectomy lateral salpingectomy with removal of cervix. It was relatively uncomplicated. There was slight oozing from her incision that not enough to require pressure dressing are dressing changes beyond one digit dressing changes discharge. 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 She is discharged home and will be followed up in 1 week for incision recheck  Consults: None  Significant Diagnostic Studies: labs: cbc3 CBC Latest Ref Rng 06/27/2015 06/22/2015 10/01/2014  WBC 4.0 - 10.5 K/uL 10.1 6.5 9.6  Hemoglobin 12.0 - 15.0 g/dL 11.5(L) 13.0 14.6  Hematocrit 36.0 - 46.0 % 35.1(L) 39.8 43.7  Platelets 150 - 400 K/uL 271 323 343      Treatments: surgery: Abdominal hysterectomy bilateral salpingectomy  Discharge Exam: Blood pressure 107/53, pulse 62, temperature 98.7 F (37.1 C), temperature source Oral, resp. rate 20, height 5\' 7"  (1.702 m), weight 100.5 kg (221 lb 9 oz), SpO2 98 %. General appearance: alert, cooperative and no distress Resp: Normal respirations GI: soft, non-tender; bowel sounds normal; no masses,  no organomegaly and Dressing changed  Disposition: 01-Home or Self Care  Discharge Instructions    Call MD for:  persistant nausea and vomiting    Complete by:  As directed      Call MD for:  severe uncontrolled pain    Complete by:  As directed      Call MD for:  temperature >100.4    Complete by:  As directed      Diet -  low sodium heart healthy    Complete by:  As directed      Increase activity slowly    Complete by:  As directed      Nursing communication    Complete by:  As directed   Please change dressing before discharge, current honeycomb is filled.            Medication List    TAKE these medications        acetaminophen 500 MG tablet  Commonly known as:  TYLENOL  Take 500 mg by mouth every 6 (six) hours as needed for pain.     oxyCODONE-acetaminophen 5-325 MG tablet  Commonly known as:  PERCOCET/ROXICET  Take 1 tablet by mouth every 4 (four) hours as needed.      ASK your doctor about these medications        IMMUNE FORMULA PO  Take 1 tablet by mouth daily.           Follow-up Information    Follow up with Tilda BurrowFERGUSON,Shakeira Rhee V, MD In 1 week.   Specialties:  Obstetrics and Gynecology, Radiology   Why:  For wound re-check   Contact information:   [address] [place] [id number] [phone]       Signed: Tilda BurrowFERGUSON,Demetric Dunnaway V 06/28/2015, 8:59 AM

## 2015-06-28 NOTE — Discharge Instructions (Signed)
Abdominal Hysterectomy, Care After °These instructions give you information on caring for yourself after your procedure. Your doctor may also give you more specific instructions. Call your doctor if you have any problems or questions after your procedure.  °HOME CARE °It takes 4-6 weeks to recover from this surgery. Follow all of your doctor's instructions.  °· Only take medicines as told by your doctor. °· Change your bandage as told by your doctor. °· Return to your doctor to have your stitches taken out. °· Take showers for 2-3 weeks. Ask your doctor when it is okay to shower. °· Do not douche, use tampons, or have sex (intercourse) for at least 6 weeks or as told. °· Follow your doctor's advice about exercise, lifting objects, driving, and general activities. °· Get plenty of rest and sleep. °· Do not lift anything heavier than a gallon of milk (about 10 pounds [4.5 kilograms]) for the first month after surgery. °· Get back to your normal diet as told by your doctor. °· Do not drink alcohol until your doctor says it is okay. °· Take a medicine to help you poop (laxative) as told by your doctor. °· Eating foods high in fiber may help you poop. Eat a lot of raw fruits and vegetables, whole grains, and beans. °· Drink enough fluids to keep your pee (urine) clear or pale yellow. °· Have someone help you at home for 1-2 weeks after your surgery. °· Keep follow-up doctor visits as told. °GET HELP IF: °· You have chills or fever. °· You have puffiness, redness, or pain in area of the cut (incision). °· You have yellowish-white fluid (pus) coming from the cut. °· You have a bad smell coming from the cut or bandage. °· Your cut pulls apart. °· You feel dizzy or light-headed. °· You have pain or bleeding when you pee. °· You keep having watery poop (diarrhea). °· You keep feeling sick to your stomach (nauseous) or keep throwing up (vomiting). °· You have fluid (discharge) coming from your vagina. °· You have a  rash. °· You have a reaction to your medicine. °· You need stronger pain medicine. °GET HELP RIGHT AWAY IF:  °· You have a fever and your symptoms suddenly get worse. °· You have bad belly (abdominal) pain. °· You have chest pain. °· You are short of breath. °· You pass out (faint). °· You have pain, puffiness, or redness of your leg. °· You bleed a lot from your vagina and notice clumps of tissue (clots). °MAKE SURE YOU:  °· Understand these instructions. °· Will watch your condition. °· Will get help right away if you are not doing well or get worse. °  °This information is not intended to replace advice given to you by your health care provider. Make sure you discuss any questions you have with your health care provider. °  °Document Released: 11/13/2007 Document Revised: 02/08/2013 Document Reviewed: 11/26/2012 °Elsevier Interactive Patient Education ©2016 [employer] ° °

## 2015-06-29 DIAGNOSIS — Z029 Encounter for administrative examinations, unspecified: Secondary | ICD-10-CM

## 2015-07-05 ENCOUNTER — Encounter: Payer: Self-pay | Admitting: Obstetrics and Gynecology

## 2015-07-05 ENCOUNTER — Telehealth: Payer: Self-pay | Admitting: Obstetrics and Gynecology

## 2015-07-05 ENCOUNTER — Ambulatory Visit (INDEPENDENT_AMBULATORY_CARE_PROVIDER_SITE_OTHER): Payer: BLUE CROSS/BLUE SHIELD | Admitting: Obstetrics and Gynecology

## 2015-07-05 VITALS — BP 100/60 | Ht 66.0 in | Wt 212.0 lb

## 2015-07-05 DIAGNOSIS — Z9889 Other specified postprocedural states: Secondary | ICD-10-CM

## 2015-07-05 DIAGNOSIS — Z90710 Acquired absence of both cervix and uterus: Secondary | ICD-10-CM

## 2015-07-05 MED ORDER — HYDROCODONE-ACETAMINOPHEN 5-325 MG PO TABS
1.0000 | ORAL_TABLET | Freq: Four times a day (QID) | ORAL | Status: DC | PRN
Start: 2015-07-05 — End: 2016-10-10

## 2015-07-05 MED ORDER — HYDROCHLOROTHIAZIDE 25 MG PO TABS: 25.0000 mg | ORAL_TABLET | Freq: Every day | ORAL | Status: DC

## 2015-07-05 NOTE — Progress Notes (Signed)
Patient ID: [name], female   DOB: June 13, 1985, 30 y.o.   MRN: [id number]  Subjective:  [name] is a 30 y.o. female now 1 weeks status post abdominal hysterectomy with bilateral salpingectomy.  Patient reports urinary frequency since the surgery. Patient states she has had itching on her hands and forearms since the surgery. Patient complains of a blistering, pruritic rash on her bilateral arms since the surgery. She states she has had to take Benadryl for this, which provides moderate relief.    Review of Systems Negative except as noted above   Diet:   normal    Bowel movements : normal.  Pain is controlled with current analgesics. Medications being used: Percocet.  Objective:  BP 100/60 mmHg  Ht 5\' 6"  (1.676 m)  Wt 212 lb (96.163 kg)  BMI 34.23 kg/m2 General:Well developed, well nourished.  No acute distress. Abdomen: Bowel sounds normal, soft, non-tender.  Incision(s):   Healing well, no drainage, no erythema, no hernia, no swelling, no dehiscence, Band-aid applied to left edge.    Urinalysis: Negative   Assessment:  Post-Op 1 weeks s/p abdominal hysterectomy with bilateral salpingectomy.   Doing well postoperatively.   Plan:  1.Wound care discussed   2. . current medications. Rx Vicodin 3. Activity restrictions: continue routine post-surgical 4. return to work: reevaluate at follow-up. 5. Continue Benadryl for rash. 6. Follow up in 4 weeks.   By signing my name below, I, Ronney LionSuzanne Le, attest that this documentation has been prepared under the direction and in the presence of Tilda BurrowJohn Spiros Greenfeld V, MD. Electronically Signed: Ronney LionSuzanne Le, ED Scribe. 07/05/2015. 10:43 AM.  I personally performed the services described in this documentation, which was SCRIBED in my presence. The recorded information has been reviewed and considered accurate. It has been edited as necessary during review. Tilda BurrowFERGUSON,Jomari Bartnik V, MD

## 2015-07-05 NOTE — Progress Notes (Signed)
Patient ID: [name], female   DOB: 1986-01-20, 30 y.o.   MRN: [id number] Pt here today for post op visit. Pt states that since having her surgery she has had itching on her hands and fore arms.

## 2015-07-05 NOTE — Telephone Encounter (Signed)
Spoke with Dr. Emelda FearFerguson and he gave a verbal order for HCTZ 25mg  x 10 tablets. Pt aware that medication has been sent to her pharmacy.

## 2015-07-06 ENCOUNTER — Encounter: Payer: BLUE CROSS/BLUE SHIELD | Admitting: Obstetrics and Gynecology

## 2015-08-02 ENCOUNTER — Encounter: Payer: Self-pay | Admitting: Obstetrics and Gynecology

## 2015-08-02 ENCOUNTER — Ambulatory Visit (INDEPENDENT_AMBULATORY_CARE_PROVIDER_SITE_OTHER): Payer: BLUE CROSS/BLUE SHIELD | Admitting: Obstetrics and Gynecology

## 2015-08-02 VITALS — BP 120/82 | Ht 66.0 in | Wt 209.0 lb

## 2015-08-02 DIAGNOSIS — Z9889 Other specified postprocedural states: Secondary | ICD-10-CM

## 2015-08-02 DIAGNOSIS — Z90710 Acquired absence of both cervix and uterus: Secondary | ICD-10-CM

## 2015-08-02 NOTE — Progress Notes (Signed)
Patient ID: [name], female   DOB: [birth year], 30 y.o.   MRN: [id number] Pt here today for post op visit.

## 2015-08-02 NOTE — Progress Notes (Signed)
Patient ID: [name], female   DOB: [birth year], 30 y.o.   MRN: [id number]  Subjective:  Tracy GripBonnie L Mohr is a 30 y.o. female now 5 weeks status post total abdominal hysterectomy with bilateral salpingectomy. She complains of some pelvic pain after urinating or defecating. Patient complains of intermittent bloody vaginal discharge. Patient states she has been using a stool softener.     Review of Systems Negative except as noted above   Diet:   normal   Bowel movements : abnormal as noted above.  Patient is having pelvic pain s/p voiding.  Objective:  BP 120/82 mmHg   Ht 5\' 6"  (1.676 m)   Wt 209 lb (94.802 kg)   BMI 33.75 kg/m2 General:Well developed, well nourished.  No acute distress. Abdomen: Bowel sounds normal, soft, non-tender.  Pelvic exam: VULVA: normal appearing vulva with no masses, tenderness or lesions,  VAGINA: cuff stiffness, about 4 cm area of thickening, with mild bloody discharge per vagina CERVIX: normal appearing cervix  Incision(s):   Healing well, no drainage, no erythema, no hernia, no swelling, no dehiscence,   Assessment:  Post-Op 5 weeks s/p total abdominal hysterectomy and bilateral salpingectomy.  Postop cuff hematoma suspected Doing well postoperatively.slow healing at cuff   Plan:  1.Wound care discussed   2. . current medications. n/a 3. Activity restrictions: none 4. return to work: will sign paperwork for pt to stay out until July 5th due to slow healing of vaginal cuff. 5. Follow up prn.   By signing my name below, I, Ronney LionSuzanne Le, attest that this documentation has been prepared under the direction and in the presence of Tilda BurrowJohn V Ferguson, MD. Electronically Signed: Ronney LionSuzanne Le, ED Scribe. 08/02/2015. 10:43 AM.  I personally performed the services described in this documentation, which was SCRIBED in my presence. The recorded information has been reviewed and considered accurate. It has been edited as necessary during review. Tilda BurrowFERGUSON,JOHN V,  MD

## 2015-08-02 NOTE — Progress Notes (Signed)
Patient ID: Amiel L Ryden, female   DOB: 04/04/1985, 30 y.o.   MRN: [id number] ° °Subjective:  °[name] is a 30 y.o. female now 5 weeks status post total abdominal hysterectomy with bilateral salpingectomy. She complains of some pelvic pain after urinating or defecating. Patient complains of intermittent bloody vaginal discharge. Patient states she has been using a stool softener.  ° °  °Review of Systems °Negative except as noted above °  Diet:   normal °  Bowel movements : abnormal as noted above. ° Patient is having pelvic pain s/p voiding. ° °Objective:  °BP 120/82 mmHg   Ht 5' 6" (1.676 m)   Wt 209 lb (94.802 kg)   BMI 33.75 kg/m2 °General:Well developed, well nourished.  No acute distress. °Abdomen: Bowel sounds normal, soft, non-tender. ° °Pelvic exam: °VULVA: normal appearing vulva with no masses, tenderness or lesions,  °VAGINA: cuff stiffness, about 4 cm area of thickening, with mild bloody discharge per vagina °CERVIX: normal appearing cervix ° °Incision(s):   Healing well, no drainage, no erythema, no hernia, no swelling, no dehiscence, ° ° °Assessment:  °Post-Op 5 weeks s/p total abdominal hysterectomy and bilateral salpingectomy.  °Postop cuff hematoma suspected °Doing well postoperatively.slow healing at cuff °  °Plan:  °1.Wound care discussed   °2. . current medications. n/a °3. Activity restrictions: none °4. return to work: will sign paperwork for pt to stay out until July 5th due to slow healing of vaginal cuff. °5. Follow up prn. °  °By signing my name below, I, Suzanne Le, attest that this documentation has been prepared under the direction and in the presence of Ismar Yabut V Halo Laski, MD. °Electronically Signed: Suzanne Le, ED Scribe. 08/02/2015. 10:43 AM. ° °I personally performed the services described in this documentation, which was SCRIBED in my presence. The recorded information has been reviewed and considered accurate. It has been edited as necessary during review. °Jeanpaul Biehl V,  MD ° °   °

## 2015-12-07 ENCOUNTER — Other Ambulatory Visit: Payer: Self-pay | Admitting: Obstetrics and Gynecology

## 2016-10-06 DIAGNOSIS — H66001 Acute suppurative otitis media without spontaneous rupture of ear drum, right ear: Secondary | ICD-10-CM | POA: Diagnosis not present

## 2016-10-10 ENCOUNTER — Emergency Department (HOSPITAL_COMMUNITY): Payer: BLUE CROSS/BLUE SHIELD

## 2016-10-10 ENCOUNTER — Emergency Department (HOSPITAL_COMMUNITY)
Admission: EM | Admit: 2016-10-10 | Discharge: 2016-10-10 | Disposition: A | Discharge status: Home / Self Care | Payer: BLUE CROSS/BLUE SHIELD | Attending: Emergency Medicine | Admitting: Emergency Medicine

## 2016-10-10 ENCOUNTER — Encounter (HOSPITAL_COMMUNITY): Payer: Self-pay | Admitting: Emergency Medicine

## 2016-10-10 DIAGNOSIS — J3489 Other specified disorders of nose and nasal sinuses: Secondary | ICD-10-CM | POA: Insufficient documentation

## 2016-10-10 DIAGNOSIS — R7989 Other specified abnormal findings of blood chemistry: Secondary | ICD-10-CM | POA: Diagnosis not present

## 2016-10-10 DIAGNOSIS — H9203 Otalgia, bilateral: Secondary | ICD-10-CM | POA: Diagnosis not present

## 2016-10-10 DIAGNOSIS — R0602 Shortness of breath: Secondary | ICD-10-CM | POA: Diagnosis not present

## 2016-10-10 DIAGNOSIS — M549 Dorsalgia, unspecified: Secondary | ICD-10-CM | POA: Insufficient documentation

## 2016-10-10 DIAGNOSIS — Z79899 Other long term (current) drug therapy: Secondary | ICD-10-CM | POA: Insufficient documentation

## 2016-10-10 DIAGNOSIS — R0600 Dyspnea, unspecified: Secondary | ICD-10-CM | POA: Diagnosis not present

## 2016-10-10 LAB — I-STAT CHEM 8, ED
BUN: 12 mg/dL (ref 6–20)
CHLORIDE: 101 mmol/L (ref 101–111)
CREATININE: 0.6 mg/dL (ref 0.44–1.00)
Calcium, Ion: 1.21 mmol/L (ref 1.15–1.40)
GLUCOSE: 92 mg/dL (ref 65–99)
HEMATOCRIT: 40 % (ref 36.0–46.0)
Hemoglobin: 13.6 g/dL (ref 12.0–15.0)
POTASSIUM: 3.7 mmol/L (ref 3.5–5.1)
Sodium: 139 mmol/L (ref 135–145)
TCO2: 26 mmol/L (ref 22–32)

## 2016-10-10 LAB — URINALYSIS, ROUTINE W REFLEX MICROSCOPIC
Bilirubin Urine: NEGATIVE
Glucose, UA: NEGATIVE mg/dL
Hgb urine dipstick: NEGATIVE
Ketones, ur: NEGATIVE mg/dL
Leukocytes, UA: NEGATIVE
NITRITE: NEGATIVE
Protein, ur: NEGATIVE mg/dL
SPECIFIC GRAVITY, URINE: 1.027 (ref 1.005–1.030)
pH: 5 (ref 5.0–8.0)

## 2016-10-10 LAB — I-STAT TROPONIN, ED: TROPONIN I, POC: 0 ng/mL (ref 0.00–0.08)

## 2016-10-10 LAB — PREGNANCY, URINE: PREG TEST UR: NEGATIVE

## 2016-10-10 LAB — D-DIMER, QUANTITATIVE (NOT AT ARMC): D DIMER QUANT: 0.53 ug{FEU}/mL — AB (ref 0.00–0.50)

## 2016-10-10 MED ORDER — NAPROXEN 250 MG PO TABS: 250.0000 mg | ORAL_TABLET | Freq: Two times a day (BID) | ORAL | 0 refills | Status: DC | PRN

## 2016-10-10 MED ORDER — METHOCARBAMOL 500 MG PO TABS: 1000.0000 mg | ORAL_TABLET | Freq: Four times a day (QID) | ORAL | 0 refills | Status: DC | PRN

## 2016-10-10 MED ORDER — IOPAMIDOL (ISOVUE-370) INJECTION 76%
100.0000 mL | Freq: Once | INTRAVENOUS | Status: AC | PRN
  Administered 2016-10-10: 100 mL via INTRAVENOUS

## 2016-10-10 NOTE — ED Triage Notes (Signed)
Pt reports seen at urgent care on Monday for earache- given antibiotic, now complaining of earache, backache, dyspnea and sorethroat

## 2016-10-10 NOTE — ED Provider Notes (Signed)
[facility] Provider Note   CSN: [id number] Arrival date & time: 10/10/16  2017     History   Chief Complaint Chief Complaint  Patient presents with  . Sore Throat  . Otalgia  . Shortness of Breath  . Back Pain    HPI RAFEEF Miranda is a 31 y.o. female.  HPI  Pt was seen at 2055.  Per pt, c/o gradual onset and persistence of constant sinus congestion and bilateral ears ache for the past 4 days. Pt was evaluated at [facility], rx amoxicillin. Pt states for the past 2 days, she has now developed left sided upper and mid-back "pain," as well as SOB. Denies fevers, no rash, no palpitations, no cough, no N/V/D, no abd pain, no low back pain.   Past Medical History:  Diagnosis Date  . Abnormal Pap smear   . Hx of chlamydia infection   . Hx of gonorrhea   . Incompetent cervix in pregnancy   . Left sided abdominal pain 09/15/2013  . No pertinent past medical history   . Positive fecal occult blood test 09/15/2013  . Rectal bleeding 09/15/2013  . Vaginal Pap smear, abnormal     Patient Active Problem List   Diagnosis Date Noted  . Status post hysterectomy 06/26/2015  . ASCUS with positive high risk HPV 06/06/2015  . Dyspareunia, female 06/06/2015  . Headache associated with sexual activity 03/20/2014  . Pelvic pain in female 03/20/2014  . Coital headache 02/24/2014  . Pelvic pain affecting pregnancy 02/24/2014  . Rectal bleeding 09/15/2013  . Left sided abdominal pain 09/15/2013  . Positive fecal occult blood test 09/15/2013  . Hx of abnormal Pap smear 06/17/2012    Past Surgical History:  Procedure Laterality Date  . ABDOMINAL HYSTERECTOMY N/A 06/26/2015   Procedure: HYSTERECTOMY ABDOMINAL;  Surgeon: Tilda Burrow, MD;  Location: [facility] ORS;  Service: Gynecology;  Laterality: N/A;  procedure 1  . BILATERAL SALPINGECTOMY Bilateral 06/26/2015   Procedure: BILATERAL SALPINGECTOMY;  Surgeon: Tilda Burrow, MD;  Location: [facility];  Service: Gynecology;  Laterality: Bilateral;    . BREAST SURGERY Left    benign cyst  . CERVICAL BIOPSY    . CERVICAL CERCLAGE N/A 2006  . CESAREAN SECTION N/A 05/16/2012   Procedure: Primary cesarean section with delivery of baby girl at 1144. Apgars 8/9.;  Surgeon: Lesly Dukes, MD;  Location: [facility]S;  Service: Obstetrics;  Laterality: N/A;  . DILATION AND CURETTAGE OF UTERUS    . ECTOPIC PREGNANCY SURGERY Left 2013  . LAPAROSCOPY FOR ECTOPIC PREGNANCY  03/10/2011   Left Linear Salpingostomy,   . REMOVAL OF NON VAGINAL CONTRACEPTIVE DEVICE N/A 06/26/2015   Procedure: Nexplanon Removal;  Surgeon: Tilda Burrow, MD;  Location: [facility];  Service: Gynecology;  Laterality: N/A;  . TONSILLECTOMY      OB History    Gravida Para Term Preterm AB Living   9 3   3 6 3    SAB TAB Ectopic Multiple Live Births   5   1   3        Home Medications    Prior to Admission medications   Medication Sig Start Date End Date Taking? Authorizing Provider  acetaminophen (TYLENOL) 500 MG tablet Take 1,000 mg by mouth every 6 (six) hours as needed for pain. Reported on 07/05/2015   Yes [provider]  amoxicillin (AMOXIL) 500 MG capsule Take 500 mg by mouth 3 (three) times daily.   Yes [provider]  diphenhydrAMINE (BENADRYL)  25 MG tablet Take 50 mg by mouth every 6 (six) hours as needed for allergies or sleep.   Yes [provider]    Family History Family History  Problem Relation Age of Onset  . Asthma Sister   . Allergies Sister   . Eczema Sister   . Asthma Brother   . Allergies Brother   . Eczema Brother   . Asthma Daughter   . Allergies Daughter   . Eczema Daughter   . Thyroid disease Maternal Grandmother        had thyroid removed  . Cancer Maternal Grandfather        lung  . Cancer Paternal Grandmother        ovarian,stomach  . Cancer Paternal Grandfather        prostate  . Allergies Daughter   . Eczema Daughter   . Endometriosis Other   . Diabetes Other   . Hypertension Other   . Cancer Other         colon and ovarian     Social History Social History  Substance Use Topics  . Smoking status: Never Smoker  . Smokeless tobacco: Never Used  . Alcohol use Yes     Comment: occasionally     Allergies   Alprazolam and Latex   Review of Systems Review of Systems ROS: Statement: All systems negative except as marked or noted in the HPI; Constitutional: Negative for fever and chills. ; ; Eyes: Negative for eye pain, redness and discharge. ; ; ENMT: Negative for hoarseness, sore throat. +ears congestion, nasal congestion, sinus pressure. ; ; Cardiovascular: Negative for palpitations, diaphoresis, and peripheral edema. ; ; Respiratory: +SOB. Negative for cough, wheezing and stridor. ; ; Gastrointestinal: Negative for nausea, vomiting, diarrhea, abdominal pain, blood in stool, hematemesis, jaundice and rectal bleeding. . ; ; Genitourinary: Negative for dysuria, flank pain and hematuria. ; ; Musculoskeletal: +upper and mid-back pain.. Negative for swelling and trauma.; ; Skin: Negative for pruritus, rash, abrasions, blisters, bruising and skin lesion.; ; Neuro: Negative for headache, lightheadedness and neck stiffness. Negative for weakness, altered level of consciousness, altered mental status, extremity weakness, paresthesias, involuntary movement, seizure and syncope.       Physical Exam Updated Vital Signs BP 140/79 (BP Location: Right Arm)   Pulse 69   Temp (!) 97.4 F (36.3 C) (Tympanic)   Ht 5\' 7"  (1.702 m)   Wt 99.8 kg (220 lb)   LMP 08/01/2014   SpO2 98%   BMI 34.46 kg/m   Physical Exam 2100: Physical examination:  Nursing notes reviewed; Vital signs and O2 SAT reviewed;  Constitutional: Well developed, Well nourished, Well hydrated, In no acute distress; Head:  Normocephalic, atraumatic; Eyes: EOMI, PERRL, No scleral icterus; ENMT: TM's clear bilat. +edemetous nasal turbinates bilat with clear rhinorrhea. Mouth and pharynx without lesions. No tonsillar exudates. No  intra-oral edema. No submandibular or sublingual edema. No hoarse voice, no drooling, no stridor. No pain with manipulation of larynx. No trismus. Mouth and pharynx normal, Mucous membranes moist; Neck: Supple, Full range of motion, No lymphadenopathy; Cardiovascular: Regular rate and rhythm, No gallop; Respiratory: Breath sounds clear & equal bilaterally, No wheezes.  Speaking full sentences with ease, Normal respiratory effort/excursion; Chest: Nontender, Movement normal; Abdomen: Soft, Nontender, Nondistended, Normal bowel sounds; Genitourinary: No CVA tenderness; Spine:  No midline CS, TS, LS tenderness. +TTP left hypertonic trapezius muscle which reproduces pt's pain. No rash;; Extremities: Pulses normal, No tenderness, No edema, No calf edema or asymmetry.; Neuro: AA&Ox3,  Major CN grossly intact.  Speech clear. No gross focal motor or sensory deficits in extremities.; Skin: Color normal, Warm, Dry.   ED Treatments / Results  Labs (all labs ordered are listed, but only abnormal results are displayed)   EKG  EKG Interpretation  Date/Time:  Friday October 10 2016 21:19:59 EDT Ventricular Rate:  65 PR Interval:    QRS Duration: 91 QT Interval:  408 QTC Calculation: 425 R Axis:   62 Text Interpretation:  Sinus rhythm Probable left atrial enlargement When compared with ECG of 05/14/2012 No significant change was found Confirmed by Samuel Jester [phone]) on 10/10/2016 9:24:57 PM       Radiology   Procedures Procedures (including critical care time)  Medications Ordered in ED Medications - No data to display   Initial Impression / Assessment and Plan / ED Course  I have reviewed the triage vital signs and the nursing notes.  Pertinent labs & imaging results that were available during my care of the patient were reviewed by me and considered in my medical decision making (see chart for details).  MDM Reviewed: previous chart, nursing note and vitals Reviewed previous: labs and  ECG Interpretation: labs, ECG, x-ray and CT scan    Results for orders placed or performed during the hospital encounter of 10/10/16  D-dimer, quantitative  Result Value Ref Range   D-Dimer, Quant 0.53 (H) 0.00 - 0.50 ug/mL-FEU  Pregnancy, urine  Result Value Ref Range   Preg Test, Ur NEGATIVE NEGATIVE  Urinalysis, Routine w reflex microscopic  Result Value Ref Range   Color, Urine YELLOW YELLOW   APPearance HAZY (A) CLEAR   Specific Gravity, Urine 1.027 1.005 - 1.030   pH 5.0 5.0 - 8.0   Glucose, UA NEGATIVE NEGATIVE mg/dL   Hgb urine dipstick NEGATIVE NEGATIVE   Bilirubin Urine NEGATIVE NEGATIVE   Ketones, ur NEGATIVE NEGATIVE mg/dL   Protein, ur NEGATIVE NEGATIVE mg/dL   Nitrite NEGATIVE NEGATIVE   Leukocytes, UA NEGATIVE NEGATIVE  I-stat Chem 8, ED  Result Value Ref Range   Sodium 139 135 - 145 mmol/L   Potassium 3.7 3.5 - 5.1 mmol/L   Chloride 101 101 - 111 mmol/L   BUN 12 6 - 20 mg/dL   Creatinine, Ser 9.14 0.44 - 1.00 mg/dL   Glucose, Bld 92 65 - 99 mg/dL   Calcium, Ion 7.82 9.56 - 1.40 mmol/L   TCO2 26 22 - 32 mmol/L   Hemoglobin 13.6 12.0 - 15.0 g/dL   HCT 21.3 08.6 - 57.8 %  I-stat troponin, ED  Result Value Ref Range   Troponin i, poc 0.00 0.00 - 0.08 ng/mL   Comment 3           Dg Chest 2 View Result Date: 10/10/2016 CLINICAL DATA:  Left-sided back pain.  Dyspnea.  Weakness EXAM: CHEST  2 VIEW COMPARISON:  None. FINDINGS: The cardiomediastinal contours are normal. The lungs are clear. Pulmonary vasculature is normal. No consolidation, pleural effusion, or pneumothorax. No acute osseous abnormalities are seen. IMPRESSION: No active cardiopulmonary disease. Electronically Signed   By: Rubye Oaks M.D.   On: 10/10/2016 21:47   Ct Angio Chest Pe W/cm &/or Wo Cm Result Date: 10/10/2016 CLINICAL DATA:  Dyspnea and backache. Pulmonary embolus suspected, low probability. Positive D-dimer. EXAM: CT ANGIOGRAPHY CHEST WITH CONTRAST TECHNIQUE: Multidetector CT  imaging of the chest was performed using the standard protocol during bolus administration of intravenous contrast. Multiplanar CT image reconstructions and MIPs were obtained to evaluate the  vascular anatomy. CONTRAST:  100 cc Isovue 370 IV COMPARISON:  Chest radiograph earlier this day. FINDINGS: Cardiovascular: There are no filling defects within the pulmonary arteries to suggest pulmonary embolus. Thoracic aorta is normal in caliber without dissection. The heart is normal in size. No pericardial effusion. Mediastinum/Nodes: No mediastinal, hilar, or axillary adenopathy. Visualized thyroid gland is normal. The esophagus is decompressed. Lungs/Pleura: Clear lungs. No consolidation. No pleural fluid or pulmonary edema. Minimal fissural thickening of the right minor and major fissure. Tiny subpleural nodule in the right middle lobe image 61 series 6, unchanged from lung bases of abdominal CT 09/19/2013 and considered benign. No discrete pulmonary nodule. Upper Abdomen: No acute abnormality. Musculoskeletal: There are no acute or suspicious osseous abnormalities. Review of the MIP images confirms the above findings. IMPRESSION: No pulmonary embolus or acute intrathoracic abnormality. Electronically Signed   By: Rubye Oaks M.D.   On: 10/10/2016 22:40    2245:  Workup reassuring. Tx symptomatically at this time. Dx and testing d/w pt and family.  Questions answered.  Verb understanding, agreeable to d/c home with outpt f/u.    Final Clinical Impressions(s) / ED Diagnoses   Final diagnoses:  None    New Prescriptions New Prescriptions   No medications on file     Samuel Jester, DO 10/11/16 [id number]

## 2016-10-10 NOTE — Discharge Instructions (Signed)
Take the prescriptions as directed.  Apply moist heat or ice to the area(s) of discomfort, for 15 minutes at a time, several times per day for the next few days.  Do not fall asleep on a heating or ice pack.  Call your regular medical doctor on Monday to schedule a follow up appointment this week.  Return to the Emergency Department immediately if worsening. ° °

## 2017-03-09 ENCOUNTER — Other Ambulatory Visit: Payer: Self-pay | Admitting: Obstetrics and Gynecology

## 2017-03-10 ENCOUNTER — Telehealth: Payer: Self-pay | Admitting: Obstetrics and Gynecology

## 2017-03-10 NOTE — Telephone Encounter (Signed)
Patient called stating that she had her med refill sent here yesterday, pt would like for us to fax her refill back. Please contact pt

## 2017-03-10 NOTE — Telephone Encounter (Signed)
Attempted to call patient back to see which medication she needs a refill on. No answer, left message to call us back with more details.

## 2017-06-08 ENCOUNTER — Encounter (HOSPITAL_COMMUNITY): Payer: Self-pay | Admitting: Emergency Medicine

## 2017-06-08 ENCOUNTER — Emergency Department (HOSPITAL_COMMUNITY): Payer: BLUE CROSS/BLUE SHIELD

## 2017-06-08 ENCOUNTER — Other Ambulatory Visit: Payer: Self-pay

## 2017-06-08 ENCOUNTER — Emergency Department (HOSPITAL_COMMUNITY)
Admission: EM | Admit: 2017-06-08 | Discharge: 2017-06-08 | Disposition: A | Discharge status: Home / Self Care | Payer: BLUE CROSS/BLUE SHIELD | Attending: Emergency Medicine | Admitting: Emergency Medicine

## 2017-06-08 DIAGNOSIS — G4489 Other headache syndrome: Secondary | ICD-10-CM | POA: Diagnosis not present

## 2017-06-08 DIAGNOSIS — R202 Paresthesia of skin: Secondary | ICD-10-CM | POA: Insufficient documentation

## 2017-06-08 DIAGNOSIS — R51 Headache: Secondary | ICD-10-CM | POA: Insufficient documentation

## 2017-06-08 DIAGNOSIS — R42 Dizziness and giddiness: Secondary | ICD-10-CM | POA: Diagnosis not present

## 2017-06-08 DIAGNOSIS — R404 Transient alteration of awareness: Secondary | ICD-10-CM | POA: Diagnosis not present

## 2017-06-08 DIAGNOSIS — R200 Anesthesia of skin: Secondary | ICD-10-CM | POA: Diagnosis not present

## 2017-06-08 DIAGNOSIS — R531 Weakness: Secondary | ICD-10-CM | POA: Diagnosis not present

## 2017-06-08 DIAGNOSIS — R519 Headache, unspecified: Secondary | ICD-10-CM

## 2017-06-08 DIAGNOSIS — Z79899 Other long term (current) drug therapy: Secondary | ICD-10-CM | POA: Diagnosis not present

## 2017-06-08 LAB — BASIC METABOLIC PANEL
Anion gap: 9 (ref 5–15)
BUN: 8 mg/dL (ref 6–20)
CO2: 24 mmol/L (ref 22–32)
Calcium: 8.6 mg/dL — ABNORMAL LOW (ref 8.9–10.3)
Chloride: 102 mmol/L (ref 101–111)
Creatinine, Ser: 0.76 mg/dL (ref 0.44–1.00)
GFR calc Af Amer: >60 mL/min (ref >60)
Glucose, Bld: 95 mg/dL (ref 65–99)
Potassium: 3.7 mmol/L (ref 3.5–5.1)
SODIUM: 135 mmol/L (ref 135–145)

## 2017-06-08 LAB — CBC WITH DIFFERENTIAL/PLATELET
BASOS ABS: 0 10*3/uL (ref 0.0–0.1)
Basophils Relative: 0 %
EOS PCT: 0 %
Eosinophils Absolute: 0 10*3/uL (ref 0.0–0.7)
HCT: 41.2 % (ref 36.0–46.0)
Hemoglobin: 13 g/dL (ref 12.0–15.0)
LYMPHS ABS: 1.6 10*3/uL (ref 0.7–4.0)
Lymphocytes Relative: 32 %
MCH: 29.5 pg (ref 26.0–34.0)
MCHC: 31.6 g/dL (ref 30.0–36.0)
MCV: 93.4 fL (ref 78.0–100.0)
Monocytes Absolute: 0.5 10*3/uL (ref 0.1–1.0)
Monocytes Relative: 9 %
Neutro Abs: 3 10*3/uL (ref 1.7–7.7)
Neutrophils Relative %: 59 %
PLATELETS: 313 10*3/uL (ref 150–400)
RBC: 4.41 MIL/uL (ref 3.87–5.11)
RDW: 12.7 % (ref 11.5–15.5)
WBC: 5 10*3/uL (ref 4.0–10.5)

## 2017-06-08 MED ORDER — METOCLOPRAMIDE HCL 5 MG/ML IJ SOLN
10.0000 mg | Freq: Once | INTRAMUSCULAR | Status: AC
  Administered 2017-06-08: 10 mg via INTRAVENOUS
  Filled 2017-06-08: qty 2

## 2017-06-08 MED ORDER — DIPHENHYDRAMINE HCL 50 MG/ML IJ SOLN
25.0000 mg | Freq: Once | INTRAMUSCULAR | Status: AC
  Administered 2017-06-08: 25 mg via INTRAVENOUS
  Filled 2017-06-08: qty 1

## 2017-06-08 MED ORDER — SODIUM CHLORIDE 0.9 % IV BOLUS
1000.0000 mL | Freq: Once | INTRAVENOUS | Status: AC
  Administered 2017-06-08: 1000 mL via INTRAVENOUS

## 2017-06-08 MED ORDER — KETOROLAC TROMETHAMINE 30 MG/ML IJ SOLN
30.0000 mg | Freq: Once | INTRAMUSCULAR | Status: AC
  Administered 2017-06-08: 30 mg via INTRAVENOUS
  Filled 2017-06-08: qty 1

## 2017-06-08 NOTE — Discharge Instructions (Addendum)
Your evaluated in the emergency department for headache with associated tingling in your left face and arm.  Your symptoms improved with some medication and we did not find an obvious cause of your headache.  You should rest and drink plenty of fluids and follow-up with your doctor.  If any recurrence of your symptoms or if any new neurologic problems he should be seen in the emergency department as soon as possible.

## 2017-06-08 NOTE — ED Triage Notes (Signed)
Patient states she has had a headache since 3am.  Took one of her husband's pain pills (oxycodone) with no relief.  States she started feeling dizzy while at work today.

## 2017-06-08 NOTE — ED Provider Notes (Signed)
[facility] EMERGENCY DEPARTMENT Provider Note   CSN: [id number] Arrival date & time: 06/08/17  1141     History   Chief Complaint No chief complaint on file.   HPI [name] is a 32 y.o. female.  She is complaining of waking up a headache around 3 AM this morning who is posterior and worse with moving her head.  She tried 1 of her husbands oxycodone and went back to bed.  Today she continued with a headache and was feeling lightheaded at work.  She noticed she was having some tingling in her left face and left arm.  She felt her hands and her back sweating.  She states she is never had a headache like this before.  Is been no trauma no recent illness.  No visual symptoms.  She was nauseous yesterday.  No vomiting.  The history is provided by the patient.  Headache   This is a new problem. The current episode started 6 to 12 hours ago. The problem occurs constantly. The problem has not changed since onset.The headache is associated with an unknown factor. The pain is located in the occipital region. The quality of the pain is described as throbbing. The pain is severe. The pain does not radiate. Associated symptoms include nausea. Pertinent negatives include no anorexia, no fever, no chest pressure, no near-syncope, no orthopnea, no palpitations, no syncope, no shortness of breath and no vomiting. Associated symptoms comments: Left face and arm tingling. She has tried oral narcotic analgesics for the symptoms. The treatment provided no relief.    Past Medical History:  Diagnosis Date  . Abnormal Pap smear   . Hx of chlamydia infection   . Hx of gonorrhea   . Incompetent cervix in pregnancy   . Left sided abdominal pain 09/15/2013  . No pertinent past medical history   . Positive fecal occult blood test 09/15/2013  . Rectal bleeding 09/15/2013  . Vaginal Pap smear, abnormal     Patient Active Problem List   Diagnosis Date Noted  . Status post hysterectomy 06/26/2015  . ASCUS  with positive high risk HPV 06/06/2015  . Dyspareunia, female 06/06/2015  . Headache associated with sexual activity 03/20/2014  . Pelvic pain in female 03/20/2014  . Coital headache 02/24/2014  . Pelvic pain affecting pregnancy 02/24/2014  . Rectal bleeding 09/15/2013  . Left sided abdominal pain 09/15/2013  . Positive fecal occult blood test 09/15/2013  . Hx of abnormal Pap smear 06/17/2012    Past Surgical History:  Procedure Laterality Date  . ABDOMINAL HYSTERECTOMY N/A 06/26/2015   Procedure: HYSTERECTOMY ABDOMINAL;  Surgeon: Tilda BurrowJohn Ferguson V, MD;  Location: [facility];  Service: Gynecology;  Laterality: N/A;  procedure 1  . BILATERAL SALPINGECTOMY Bilateral 06/26/2015   Procedure: BILATERAL SALPINGECTOMY;  Surgeon: Tilda BurrowJohn Ferguson V, MD;  Location: [facility];  Service: Gynecology;  Laterality: Bilateral;  . BREAST SURGERY Left    benign cyst  . CERVICAL BIOPSY    . CERVICAL CERCLAGE N/A 2006  . CESAREAN SECTION N/A 05/16/2012   Procedure: Primary cesarean section with delivery of baby girl at 1144. Apgars 8/9.;  Surgeon: Lesly DukesKelly H Leggett, MD;  Location: [facility];  Service: Obstetrics;  Laterality: N/A;  . DILATION AND CURETTAGE OF UTERUS    . ECTOPIC PREGNANCY SURGERY Left 2013  . LAPAROSCOPY FOR ECTOPIC PREGNANCY  03/10/2011   Left Linear Salpingostomy,   . REMOVAL OF NON VAGINAL CONTRACEPTIVE DEVICE N/A 06/26/2015   Procedure: Nexplanon Removal;  Surgeon: Jonny RuizJohn  Benancio Deeds, MD;  Location: [facility];  Service: Gynecology;  Laterality: N/A;  . TONSILLECTOMY       OB History    Gravida  9   Para  3   Term      Preterm  3   AB  6   Living  3     SAB  5   TAB      Ectopic  1   Multiple      Live Births  3            Home Medications    Prior to Admission medications   Medication Sig Start Date End Date Taking? Authorizing Provider  acetaminophen (TYLENOL) 500 MG tablet Take 1,000 mg by mouth every 6 (six) hours as needed for pain. Reported on 07/05/2015    [provider]  acyclovir (ZOVIRAX) 400 MG tablet TAKE (1) TABLET BY MOUTH TWICE DAILY. 03/11/17   Tilda Burrow, MD  amoxicillin (AMOXIL) 500 MG capsule Take 500 mg by mouth 3 (three) times daily.    [provider]  diphenhydrAMINE (BENADRYL) 25 MG tablet Take 50 mg by mouth every 6 (six) hours as needed for allergies or sleep.    [provider]  methocarbamol (ROBAXIN) 500 MG tablet Take 2 tablets (1,000 mg total) by mouth 4 (four) times daily as needed for muscle spasms (muscle spasm/pain). 10/10/16   Samuel Jester, DO  naproxen (NAPROSYN) 250 MG tablet Take 1 tablet (250 mg total) by mouth 2 (two) times daily as needed for mild pain or moderate pain (take with food). 10/10/16   Samuel Jester, DO    Family History Family History  Problem Relation Age of Onset  . Asthma Sister   . Allergies Sister   . Eczema Sister   . Asthma Brother   . Allergies Brother   . Eczema Brother   . Asthma Daughter   . Allergies Daughter   . Eczema Daughter   . Thyroid disease Maternal Grandmother        had thyroid removed  . Cancer Maternal Grandfather        lung  . Cancer Paternal Grandmother        ovarian,stomach  . Cancer Paternal Grandfather        prostate  . Allergies Daughter   . Eczema Daughter   . Endometriosis Other   . Diabetes Other   . Hypertension Other   . Cancer Other        colon and ovarian     Social History Social History   Tobacco Use  . Smoking status: Never Smoker  . Smokeless tobacco: Never Used  Substance Use Topics  . Alcohol use: Yes    Comment: occasionally  . Drug use: No     Allergies   Alprazolam and Latex   Review of Systems Review of Systems  Constitutional: Positive for diaphoresis. Negative for chills and fever.  HENT: Negative for ear pain and sore throat.   Eyes: Negative for pain and visual disturbance.  Respiratory: Negative for cough and shortness of breath.   Cardiovascular: Negative for chest pain,  palpitations, orthopnea, syncope and near-syncope.  Gastrointestinal: Positive for nausea. Negative for abdominal pain, anorexia and vomiting.  Genitourinary: Negative for dysuria and hematuria.  Musculoskeletal: Negative for arthralgias and back pain.  Skin: Negative for color change and rash.  Neurological: Positive for light-headedness, numbness and headaches. Negative for seizures, syncope and speech difficulty.  All other systems reviewed and are  negative.    Physical Exam Updated Vital Signs BP 131/89   Pulse 64   Temp 98.6 F (37 C) (Oral)   Resp 11   Ht 5\' 7"  (1.702 m)   Wt 99.8 kg (220 lb)   LMP 08/01/2014   SpO2 98%   BMI 34.46 kg/m   Physical Exam  Constitutional: She is oriented to person, place, and time. She appears well-developed and well-nourished. No distress.  HENT:  Head: Normocephalic and atraumatic.  Eyes: Conjunctivae are normal.  Neck: Full passive range of motion without pain. Neck supple. No Brudzinski's sign and no Kernig's sign noted.  Cardiovascular: Normal rate and regular rhythm.  No murmur heard. Pulmonary/Chest: Effort normal and breath sounds normal. No respiratory distress.  Abdominal: Soft. There is no tenderness.  Musculoskeletal: She exhibits no edema.  Neurological: She is alert and oriented to person, place, and time. She has normal strength. A sensory deficit (subjective decreased sensation l face and arm) is present. No cranial nerve deficit. GCS eye subscore is 4. GCS verbal subscore is 5. GCS motor subscore is 6.  Nl proprioception  Skin: Skin is warm and dry.  Psychiatric: She has a normal mood and affect.  Nursing note and vitals reviewed.    ED Treatments / Results  Labs (all labs ordered are listed, but only abnormal results are displayed) Labs Reviewed  BASIC METABOLIC PANEL - Abnormal; Notable for the following components:      Result Value   Calcium 8.6 (*)    All other components within normal limits  CBC WITH  DIFFERENTIAL/PLATELET    EKG EKG Interpretation  Date/Time:  Monday June 08 2017 11:52:53 EDT Ventricular Rate:  69 PR Interval:    QRS Duration: 85 QT Interval:  382 QTC Calculation: 410 R Axis:   42 Text Interpretation:  Sinus rhythm Consider right atrial enlargement When compared to 8/18 No significant change was found Confirmed by Meridee Score [phone]) on 06/08/2017 12:17:46 PM   Radiology Ct Head Wo Contrast  Result Date: 06/08/2017 CLINICAL DATA:  Headache. EXAM: CT HEAD WITHOUT CONTRAST TECHNIQUE: Contiguous axial images were obtained from the base of the skull through the vertex without intravenous contrast. COMPARISON:  CT scan of May 24, 2004. FINDINGS: Brain: No evidence of acute infarction, hemorrhage, hydrocephalus, extra-axial collection or mass lesion/mass effect. Vascular: No hyperdense vessel or unexpected calcification. Skull: Normal. Negative for fracture or focal lesion. Sinuses/Orbits: No acute finding. Other: None. IMPRESSION: Normal head CT. Electronically Signed   By: Lupita Raider, M.D.   On: 06/08/2017 13:05    Procedures Procedures (including critical care time)  Medications Ordered in ED Medications  metoCLOPramide (REGLAN) injection 10 mg (has no administration in time range)  diphenhydrAMINE (BENADRYL) injection 25 mg (has no administration in time range)  sodium chloride 0.9 % bolus 1,000 mL (has no administration in time range)     Initial Impression / Assessment and Plan / ED Course  I have reviewed the triage vital signs and the nursing notes.  Pertinent labs & imaging results that were available during my care of the patient were reviewed by me and considered in my medical decision making (see chart for details).  Clinical Course as of Jun 09 1109  Mon Jun 08, 2017  1359 Reevaluated patient.  Headaches improved from 8 to about a 6.  Her CNS imaging was negative and her labs were unremarkable.   [MB]  1541 Reevaluated patient headache  much improved and her tingling has resolved except  for some intermittent tingling in her hand.  We reviewed that this was not an extensive work-up and that her symptoms recur or her associate with any neurologic findings that she should return to the emergency department.  She should follow-up with her primary care doctor.   [MB]    Clinical Course User Index [MB] Terrilee Files, MD     Final Clinical Impressions(s) / ED Diagnoses   Final diagnoses:  Acute nonintractable headache, unspecified headache type  Paresthesias    ED Discharge Orders    None       Terrilee Files, MD 06/09/17 1112

## 2017-06-15 DIAGNOSIS — G43839 Menstrual migraine, intractable, without status migrainosus: Secondary | ICD-10-CM | POA: Diagnosis not present

## 2017-06-15 DIAGNOSIS — G43111 Migraine with aura, intractable, with status migrainosus: Secondary | ICD-10-CM | POA: Diagnosis not present

## 2017-06-15 DIAGNOSIS — Z049 Encounter for examination and observation for unspecified reason: Secondary | ICD-10-CM | POA: Diagnosis not present

## 2017-06-16 DIAGNOSIS — R51 Headache: Secondary | ICD-10-CM | POA: Diagnosis not present

## 2017-06-16 DIAGNOSIS — G43719 Chronic migraine without aura, intractable, without status migrainosus: Secondary | ICD-10-CM | POA: Diagnosis not present

## 2017-06-16 DIAGNOSIS — M542 Cervicalgia: Secondary | ICD-10-CM | POA: Diagnosis not present

## 2017-06-16 DIAGNOSIS — M791 Myalgia, unspecified site: Secondary | ICD-10-CM | POA: Diagnosis not present

## 2017-06-16 DIAGNOSIS — G43111 Migraine with aura, intractable, with status migrainosus: Secondary | ICD-10-CM | POA: Diagnosis not present

## 2017-06-16 DIAGNOSIS — G518 Other disorders of facial nerve: Secondary | ICD-10-CM | POA: Diagnosis not present

## 2017-06-16 DIAGNOSIS — G43839 Menstrual migraine, intractable, without status migrainosus: Secondary | ICD-10-CM | POA: Diagnosis not present

## 2017-06-17 ENCOUNTER — Ambulatory Visit: Payer: BLUE CROSS/BLUE SHIELD | Admitting: Obstetrics and Gynecology

## 2017-06-17 ENCOUNTER — Encounter: Payer: Self-pay | Admitting: Obstetrics and Gynecology

## 2017-06-17 ENCOUNTER — Telehealth: Payer: Self-pay | Admitting: *Deleted

## 2017-06-17 ENCOUNTER — Other Ambulatory Visit: Payer: Self-pay | Admitting: *Deleted

## 2017-06-17 VITALS — BP 128/70 | HR 54 | Ht 67.0 in | Wt 216.8 lb

## 2017-06-17 DIAGNOSIS — N644 Mastodynia: Secondary | ICD-10-CM

## 2017-06-17 DIAGNOSIS — B354 Tinea corporis: Secondary | ICD-10-CM | POA: Diagnosis not present

## 2017-06-17 NOTE — Progress Notes (Signed)
Patient ID: [name], female   DOB: 09/12/1985, 32 y.o.   MRN: [id number]   [facility] Clinic Visit  @            Patient name: [name] MRN [id number]  Date of birth: 1985-09-16  CC & HPI:  [name] is a 32 y.o. female presenting today for vaginal rawness for the last few weeks. She has tried monistat with mild relief. She has noticed that her right breast is harder then normal. Occasional right axilla discomfort that will radiate to her breast. She denies fever, chills or any other symptoms or complaints at this time.   She recently found out that she has migraines and was seen in the ED on 06/08/2017. She had 16 injection in her head and neck yesterday 4/30.  ROS:  ROS +vaginal rawness +right breast firmness -fever -chills All systems are negative except as noted in the HPI and PMH.   Pertinent History Reviewed:   Reviewed: Significant for chlamydia, gonorrhea, abdominal hysterectomy Medical         Past Medical History:  Diagnosis Date  . Abnormal Pap smear   . Hx of chlamydia infection   . Hx of gonorrhea   . Incompetent cervix in pregnancy   . Left sided abdominal pain 09/15/2013  . Migraines   . No pertinent past medical history   . Positive fecal occult blood test 09/15/2013  . Rectal bleeding 09/15/2013  . Vaginal Pap smear, abnormal                               Surgical Hx:    Past Surgical History:  Procedure Laterality Date  . ABDOMINAL HYSTERECTOMY N/A 06/26/2015   Procedure: HYSTERECTOMY ABDOMINAL;  Surgeon: Tilda Burrow, MD;  Location: [facility];  Service: Gynecology;  Laterality: N/A;  procedure 1  . BILATERAL SALPINGECTOMY Bilateral 06/26/2015   Procedure: BILATERAL SALPINGECTOMY;  Surgeon: Tilda Burrow, MD;  Location: [facility];  Service: Gynecology;  Laterality: Bilateral;  . BREAST SURGERY Left    benign cyst  . CERVICAL BIOPSY    . CERVICAL CERCLAGE N/A 2006  . CESAREAN SECTION N/A 05/16/2012   Procedure: Primary cesarean section  with delivery of baby girl at 1144. Apgars 8/9.;  Surgeon: Lesly Dukes, MD;  Location: [facility];  Service: Obstetrics;  Laterality: N/A;  . DILATION AND CURETTAGE OF UTERUS    . ECTOPIC PREGNANCY SURGERY Left 2013  . LAPAROSCOPY FOR ECTOPIC PREGNANCY  03/10/2011   Left Linear Salpingostomy,   . REMOVAL OF NON VAGINAL CONTRACEPTIVE DEVICE N/A 06/26/2015   Procedure: Nexplanon Removal;  Surgeon: Tilda Burrow, MD;  Location: [facility];  Service: Gynecology;  Laterality: N/A;  . TONSILLECTOMY     Medications: Reviewed & Updated - see associated section                       Current Outpatient Medications:  .  acyclovir (ZOVIRAX) 400 MG tablet, TAKE (1) TABLET BY MOUTH TWICE DAILY. (Patient taking differently: TAKE (1) TABLET BY MOUTH TWICE DAILY PRN), Disp: 60 tablet, Rfl: 11 .  diphenhydrAMINE (BENADRYL) 25 MG tablet, Take 50 mg by mouth every 6 (six) hours as needed for allergies or sleep., Disp: , Rfl:  .  metoCLOPramide (REGLAN) 10 MG tablet, 10 mg once a week. , Disp: , Rfl:  .  MICONAZOLE NITRATE VAGINAL (MONISTAT 3) 4 %  CREA, Place vaginally as needed., Disp: , Rfl:  .  QUDEXY XR CS24 sprinkle cap, , Disp: , Rfl:    Social History: Reviewed -  reports that she has never smoked. She has never used smokeless tobacco.  Objective Findings:  Vitals: Blood pressure 128/70, pulse (!) 54, height  (1.702 m), weight 216 lb 12.8 oz (98.3 kg), last menstrual period 08/01/2014.  PHYSICAL EXAMINATION General appearance - alert, well appearing, and in no distress, oriented to person, place, and time and overweight Mental status - alert, oriented to person, place, and time, normal mood, behavior, speech, dress, motor activity, and thought processes, affect appropriate to mood Breasts - 4 cm area of difusse firmness on right, no dippling  PELVIC External genitalia - normal , shaving , advised to reduce to trimming, dry vulva regaime Vulva - erythema at hair follicles,  Vagina - defer Cervix -  surgically absent Uterus - surgically absent  Assessment & Plan:   A:  1. Fibrocystic breast changes R>L 2. Tinea corporis  P:  1. Diagnostic Mammogram 2. Use cotton panties, keep vagina clean and dry continue topical monistat.  3. Trim, dont shave. Dry vulva regimen 4. PRN  By signing my name below, I, [name], attest that this documentation has been prepared under the direction and in the presence of Tilda Burrow, MD. Electronically Signed: [name], Medical Scribe. 06/17/17. 12:04 PM.  I personally performed the services described in this documentation, which was SCRIBED in my presence. The recorded information has been reviewed and considered accurate. It has been edited as necessary during review. Tilda Burrow, MD

## 2017-06-17 NOTE — Telephone Encounter (Signed)
LMOVM that she is scheduled for diagnostic mammogram at [facility] on Tuesday, May 7 at 3:20 but needed to arrive at 3:10. No deodorant, powders or lotions. Advised to call if that appointment day or time did not work for her.

## 2017-06-23 ENCOUNTER — Ambulatory Visit (HOSPITAL_COMMUNITY)
Admission: RE | Admit: 2017-06-23 | Discharge: 2017-06-23 | Disposition: A | Discharge status: Home / Self Care | Payer: BLUE CROSS/BLUE SHIELD | Source: Ambulatory Visit | Attending: Obstetrics and Gynecology | Admitting: Obstetrics and Gynecology

## 2017-06-23 DIAGNOSIS — N644 Mastodynia: Secondary | ICD-10-CM

## 2017-06-23 DIAGNOSIS — R922 Inconclusive mammogram: Secondary | ICD-10-CM | POA: Diagnosis not present

## 2017-06-23 DIAGNOSIS — N6001 Solitary cyst of right breast: Secondary | ICD-10-CM | POA: Diagnosis not present

## 2017-06-30 DIAGNOSIS — G500 Trigeminal neuralgia: Secondary | ICD-10-CM | POA: Diagnosis not present

## 2017-06-30 DIAGNOSIS — G509 Disorder of trigeminal nerve, unspecified: Secondary | ICD-10-CM | POA: Diagnosis not present

## 2017-06-30 DIAGNOSIS — M791 Myalgia, unspecified site: Secondary | ICD-10-CM | POA: Diagnosis not present

## 2017-06-30 DIAGNOSIS — G43111 Migraine with aura, intractable, with status migrainosus: Secondary | ICD-10-CM | POA: Diagnosis not present

## 2017-06-30 DIAGNOSIS — G43719 Chronic migraine without aura, intractable, without status migrainosus: Secondary | ICD-10-CM | POA: Diagnosis not present

## 2017-06-30 DIAGNOSIS — G518 Other disorders of facial nerve: Secondary | ICD-10-CM | POA: Diagnosis not present

## 2017-06-30 DIAGNOSIS — M542 Cervicalgia: Secondary | ICD-10-CM | POA: Diagnosis not present

## 2017-06-30 DIAGNOSIS — R51 Headache: Secondary | ICD-10-CM | POA: Diagnosis not present

## 2017-07-03 DIAGNOSIS — J329 Chronic sinusitis, unspecified: Secondary | ICD-10-CM | POA: Diagnosis not present

## 2017-07-03 DIAGNOSIS — Z13220 Encounter for screening for lipoid disorders: Secondary | ICD-10-CM | POA: Diagnosis not present

## 2017-07-03 DIAGNOSIS — R05 Cough: Secondary | ICD-10-CM | POA: Diagnosis not present

## 2017-07-03 DIAGNOSIS — G43809 Other migraine, not intractable, without status migrainosus: Secondary | ICD-10-CM | POA: Diagnosis not present

## 2017-07-15 DIAGNOSIS — G43719 Chronic migraine without aura, intractable, without status migrainosus: Secondary | ICD-10-CM | POA: Diagnosis not present

## 2017-07-15 DIAGNOSIS — R51 Headache: Secondary | ICD-10-CM | POA: Diagnosis not present

## 2017-07-15 DIAGNOSIS — G500 Trigeminal neuralgia: Secondary | ICD-10-CM | POA: Diagnosis not present

## 2017-07-15 DIAGNOSIS — G4482 Headache associated with sexual activity: Secondary | ICD-10-CM | POA: Diagnosis not present

## 2017-07-15 DIAGNOSIS — G509 Disorder of trigeminal nerve, unspecified: Secondary | ICD-10-CM | POA: Diagnosis not present

## 2017-07-15 DIAGNOSIS — G43111 Migraine with aura, intractable, with status migrainosus: Secondary | ICD-10-CM | POA: Diagnosis not present

## 2017-07-15 DIAGNOSIS — M542 Cervicalgia: Secondary | ICD-10-CM | POA: Diagnosis not present

## 2017-07-15 DIAGNOSIS — M791 Myalgia, unspecified site: Secondary | ICD-10-CM | POA: Diagnosis not present

## 2017-07-15 DIAGNOSIS — G43839 Menstrual migraine, intractable, without status migrainosus: Secondary | ICD-10-CM | POA: Diagnosis not present

## 2017-07-15 DIAGNOSIS — G518 Other disorders of facial nerve: Secondary | ICD-10-CM | POA: Diagnosis not present

## 2017-07-17 DIAGNOSIS — R51 Headache: Secondary | ICD-10-CM | POA: Diagnosis not present

## 2017-07-23 ENCOUNTER — Other Ambulatory Visit (HOSPITAL_COMMUNITY): Payer: Self-pay | Admitting: Acute Care

## 2017-07-23 DIAGNOSIS — G44209 Tension-type headache, unspecified, not intractable: Secondary | ICD-10-CM

## 2017-07-29 ENCOUNTER — Ambulatory Visit (HOSPITAL_COMMUNITY)
Admission: RE | Admit: 2017-07-29 | Discharge: 2017-07-29 | Disposition: A | Discharge status: Home / Self Care | Payer: BLUE CROSS/BLUE SHIELD | Source: Ambulatory Visit | Attending: Acute Care | Admitting: Acute Care

## 2017-07-29 DIAGNOSIS — R51 Headache: Secondary | ICD-10-CM | POA: Insufficient documentation

## 2017-07-29 DIAGNOSIS — G44209 Tension-type headache, unspecified, not intractable: Secondary | ICD-10-CM

## 2017-09-04 DIAGNOSIS — R51 Headache: Secondary | ICD-10-CM | POA: Diagnosis not present

## 2017-09-22 DIAGNOSIS — R200 Anesthesia of skin: Secondary | ICD-10-CM | POA: Diagnosis not present

## 2017-09-22 DIAGNOSIS — M542 Cervicalgia: Secondary | ICD-10-CM | POA: Diagnosis not present

## 2017-09-22 DIAGNOSIS — M545 Low back pain: Secondary | ICD-10-CM | POA: Diagnosis not present

## 2017-09-22 DIAGNOSIS — G8929 Other chronic pain: Secondary | ICD-10-CM | POA: Diagnosis not present

## 2017-10-07 DIAGNOSIS — H5203 Hypermetropia, bilateral: Secondary | ICD-10-CM | POA: Diagnosis not present

## 2017-12-30 DIAGNOSIS — M5412 Radiculopathy, cervical region: Secondary | ICD-10-CM | POA: Diagnosis not present

## 2017-12-30 DIAGNOSIS — G43809 Other migraine, not intractable, without status migrainosus: Secondary | ICD-10-CM | POA: Diagnosis not present

## 2017-12-31 ENCOUNTER — Other Ambulatory Visit: Payer: Self-pay | Admitting: Internal Medicine

## 2017-12-31 DIAGNOSIS — M5412 Radiculopathy, cervical region: Secondary | ICD-10-CM

## 2018-01-05 ENCOUNTER — Other Ambulatory Visit: Payer: Self-pay | Admitting: Internal Medicine

## 2018-01-05 DIAGNOSIS — M5412 Radiculopathy, cervical region: Secondary | ICD-10-CM

## 2018-01-22 ENCOUNTER — Ambulatory Visit: Admission: RE | Admit: 2018-01-22 | Payer: BLUE CROSS/BLUE SHIELD | Source: Ambulatory Visit

## 2018-01-22 ENCOUNTER — Ambulatory Visit
Admission: RE | Admit: 2018-01-22 | Discharge: 2018-01-22 | Disposition: A | Discharge status: Home / Self Care | Payer: BLUE CROSS/BLUE SHIELD | Source: Ambulatory Visit | Attending: Internal Medicine | Admitting: Internal Medicine

## 2018-01-22 DIAGNOSIS — M5412 Radiculopathy, cervical region: Secondary | ICD-10-CM

## 2018-01-22 DIAGNOSIS — M50221 Other cervical disc displacement at C4-C5 level: Secondary | ICD-10-CM | POA: Diagnosis not present

## 2018-01-22 DIAGNOSIS — M50222 Other cervical disc displacement at C5-C6 level: Secondary | ICD-10-CM | POA: Diagnosis not present

## 2018-03-05 ENCOUNTER — Ambulatory Visit: Payer: BLUE CROSS/BLUE SHIELD | Admitting: Diagnostic Neuroimaging

## 2018-03-16 ENCOUNTER — Ambulatory Visit: Payer: BLUE CROSS/BLUE SHIELD | Admitting: Neurology

## 2018-03-16 ENCOUNTER — Encounter: Payer: Self-pay | Admitting: Neurology

## 2018-03-16 ENCOUNTER — Other Ambulatory Visit: Payer: Self-pay

## 2018-03-16 DIAGNOSIS — G43709 Chronic migraine without aura, not intractable, without status migrainosus: Secondary | ICD-10-CM

## 2018-03-16 DIAGNOSIS — IMO0002 Reserved for concepts with insufficient information to code with codable children: Secondary | ICD-10-CM

## 2018-03-16 DIAGNOSIS — R200 Anesthesia of skin: Secondary | ICD-10-CM | POA: Diagnosis not present

## 2018-03-16 DIAGNOSIS — R208 Other disturbances of skin sensation: Secondary | ICD-10-CM

## 2018-03-16 MED ORDER — GALCANEZUMAB-GNLM 120 MG/ML ~~LOC~~ SOAJ: 1.0000 "pen " | SUBCUTANEOUS | 4 refills | Status: DC

## 2018-03-16 NOTE — Progress Notes (Signed)
[facility]  PATIENT: [name] DOB: 1985-04-22  REFERRING DOCTOR OR PCP: Marcelino Duster, MD SOURCE: Patient, notes from Dr. Letitia Libra, notes from [facility] neurology, EMG report, imaging report, MRI images personally reviewed.  _________________________________   HISTORICAL  CHIEF COMPLAINT:  Chief Complaint  Patient presents with  . New Patient (Initial Visit)    RM 11 with husband. Paper referral from Dr. Letitia Libra for cervical radiculopathy. Having Ha's, blurry vision. This started 06/08/2017.  This lead to her losing feeling on her left side. She saw neurologist but wanted a second opinion.    HISTORY OF PRESENT ILLNESS:  I had the pleasure seeing your patient, [name], at [facility] for a neurologic consultation regarding her headache, neck pain and left-sided numbness.  She is a 33 year old woman who had the onset of headache 06/08/2017 with pain in the left occiput and nausea.   She took an oxycodone but had no benefit.   The next day the headache persisted and she had left sided numbness in the arm and leg.  She was also was sweating profusely.    [facility]   She had a series of multiple injections without benefit.  She was also placed on Quedexy XR 225 and Reglan prn.    This did not help much and headaches persisted, often associated with left sided numbness.  She saw Dr. Malvin Johns and Ms. Lindi Adie, NP at [facility] in [place].   She was placed her on nortriptyline.  Initially, she overlapped with Qudexy but then because of side effects stop the Qudexy and stayed on the nortriptyline.  This helped slightly at 50 mg nightly.     She has not been on any of the anti-CGRP agents.  Currently, her headache is located over the left convexity and she has left > right occipital tenderness.   She is not on any medication.    She also notes a mental fog.  The numbness on the left side of her body  persist, similar to several months ago.  She notes photophobia and phonophobia.   She has nausea but not vomiting.     Moving the head sometimes worsens the pain.   She has had difficulty with bladder and bowel control with episodes of incontinence.   These episodes are worse when the headache is more intense.  I personally reviewed the MRI of the brain 07/29/17 and cervical spine 01/22/2018.  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  The sella turcica is slightly enlarged though pituitary tissue is not flattened so this is likely a normal variant.  The MRI of the cervical spine shows a very small disc bulge at C5-C6 and is otherwise normal.  The spinal cord is normal.  There is no nerve root compression or spinal stenosis.     I also reviewed the nerve conduction and EMG by Dr. Malvin Johns performed 09/22/2017.  He felt she might have a very mild mid cervical polyradiculopathy based on right cervical paraspinal muscles showing some polyphasic units.  All of the muscles in the left arm were normal.  The nerve conductions were normal.  Notes from [facility] neurology [place] reviewed and summarized above.  REVIEW OF SYSTEMS: Constitutional: No fevers, chills, sweats, or change in appetite.  She always feels fatigued despite sleeping greater than 7 hours. Eyes: No visual changes, double vision, eye pain Ear, nose and throat: No hearing loss,  ear pain, nasal congestion, sore throat Cardiovascular: No chest pain, palpitations Respiratory: No shortness of breath at rest or with exertion.   No wheezes.  She snores but does not have any gasping snorting or pauses at night GastrointestinaI: No nausea, vomiting, diarrhea, abdominal pain.  Some episodes of fecal incontinence Genitourinary:She reports no episodes of incontinence.  She reports pelvic pain.. Musculoskeletal: No neck pain, back pain Integumentary: No rash, pruritus, skin lesions Neurological: as above Psychiatric: She  denies depression or anxiety. Endocrine: No palpitations, diaphoresis, change in appetite, change in weigh or increased thirst Hematologic/Lymphatic: No anemia, purpura, petechiae. Allergic/Immunologic: No itchy/runny eyes, nasal congestion, recent allergic reactions, rashes  ALLERGIES: Allergies  Allergen Reactions  . Alprazolam     UNKNOWN REACTION: Placed in Hospital  . Latex     Contraceptive    HOME MEDICATIONS:  Current Outpatient Medications:  .  acyclovir (ZOVIRAX) 400 MG tablet, TAKE (1) TABLET BY MOUTH TWICE DAILY. (Patient taking differently: TAKE (1) TABLET BY MOUTH TWICE DAILY PRN), Disp: 60 tablet, Rfl: 11 .  diphenhydrAMINE (BENADRYL) 25 MG tablet, Take 50 mg by mouth every 6 (six) hours as needed for allergies or sleep., Disp: , Rfl:  .  metoCLOPramide (REGLAN) 10 MG tablet, 10 mg once a week. , Disp: , Rfl:  .  Galcanezumab-gnlm (EMGALITY) 120 MG/ML SOAJ, Inject 1 pen into the skin every 28 (twenty-eight) days., Disp: 3 pen, Rfl: 4  PAST MEDICAL HISTORY: Past Medical History:  Diagnosis Date  . Abnormal Pap smear   . Hx of chlamydia infection   . Hx of gonorrhea   . Incompetent cervix in pregnancy   . Left sided abdominal pain 09/15/2013  . Migraines   . No pertinent past medical history   . Positive fecal occult blood test 09/15/2013  . Rectal bleeding 09/15/2013  . Vaginal Pap smear, abnormal     PAST SURGICAL HISTORY: Past Surgical History:  Procedure Laterality Date  . ABDOMINAL HYSTERECTOMY N/A 06/26/2015   Procedure: HYSTERECTOMY ABDOMINAL;  Surgeon: Tilda BurrowJohn Ferguson V, MD;  Location: [facility];  Service: Gynecology;  Laterality: N/A;  procedure 1  . BILATERAL SALPINGECTOMY Bilateral 06/26/2015   Procedure: BILATERAL SALPINGECTOMY;  Surgeon: Tilda BurrowJohn Ferguson V, MD;  Location: [facility];  Service: Gynecology;  Laterality: Bilateral;  . BREAST SURGERY Left    benign cyst  . CERVICAL BIOPSY    . CERVICAL CERCLAGE N/A 2006  . CESAREAN SECTION N/A 05/16/2012    Procedure: Primary cesarean section with delivery of baby girl at 1144. Apgars 8/9.;  Surgeon: Lesly DukesKelly H Leggett, MD;  Location: [facility];  Service: Obstetrics;  Laterality: N/A;  . DILATION AND CURETTAGE OF UTERUS    . ECTOPIC PREGNANCY SURGERY Left 2013  . LAPAROSCOPY FOR ECTOPIC PREGNANCY  03/10/2011   Left Linear Salpingostomy,   . REMOVAL OF NON VAGINAL CONTRACEPTIVE DEVICE N/A 06/26/2015   Procedure: Nexplanon Removal;  Surgeon: Tilda BurrowJohn Ferguson V, MD;  Location: [facility];  Service: Gynecology;  Laterality: N/A;  . TONSILLECTOMY      FAMILY HISTORY: Family History  Problem Relation Age of Onset  . Asthma Sister   . Allergies Sister   . Eczema Sister   . Asthma Brother   . Allergies Brother   . Eczema Brother   . Asthma Daughter   . Allergies Daughter   . Eczema Daughter   . Thyroid disease Maternal Grandmother        had thyroid removed  . Cancer Maternal Grandfather  lung  . Cancer Paternal Grandmother        ovarian,stomach  . Cancer Paternal Grandfather        prostate  . Allergies Daughter   . Eczema Daughter   . Endometriosis Other   . Diabetes Other   . Hypertension Other   . Cancer Other        colon and ovarian     SOCIAL HISTORY:  Social History   Socioeconomic History  . Marital status: Married    Spouse name: Barry DienesJames Stephens  . Number of children: 3  . Years of education: 7314  . Highest education level: Not on file  Occupational History  . Occupation: [employer]  Social Needs  . Financial resource strain: Not on file  . Food insecurity:    Worry: Not on file    Inability: Not on file  . Transportation needs:    Medical: Not on file    Non-medical: Not on file  Tobacco Use  . Smoking status: Never Smoker  . Smokeless tobacco: Never Used  Substance and Sexual Activity  . Alcohol use: Yes    Comment: occasionally  . Drug use: No  . Sexual activity: Yes    Birth control/protection: Surgical    Comment: hyst  Lifestyle  . Physical  activity:    Days per week: Not on file    Minutes per session: Not on file  . Stress: Not on file  Relationships  . Social connections:    Talks on phone: Not on file    Gets together: Not on file    Attends religious service: Not on file    Active member of club or organization: Not on file    Attends meetings of clubs or organizations: Not on file    Relationship status: Not on file  . Intimate partner violence:    Fear of current or ex partner: Not on file    Emotionally abused: Not on file    Physically abused: Not on file    Forced sexual activity: Not on file  Other Topics Concern  . Not on file  Social History Narrative   Lives w/ husband and children   Caffeine use: About 5-6 pepsi's per day   Right handed      PHYSICAL EXAM  Vitals:   03/16/18 1027  BP: 117/72  Pulse: 63  Weight: 222 lb 8 oz (100.9 kg)  Height: 5\' 7"  (1.702 m)    Body mass index is 34.85 kg/m.   General: The patient is well-developed and well-nourished and in no acute distress  Eyes:  Funduscopic exam shows normal optic discs and retinal vessels.  Neck: The neck is supple, no carotid bruits are noted.  The neck is nontender.  Cardiovascular: The heart has a regular rate and rhythm with a normal S1 and S2. There were no murmurs, gallops or rubs. Lungs are clear to auscultation.  Skin: Extremities are without significant edema.  Musculoskeletal:  Back is nontender  Neurologic Exam  Mental status: The patient is alert and oriented x 3 at the time of the examination. The patient has apparent normal recent and remote memory, with an apparently normal attention span and concentration ability.   Speech is normal.  Cranial nerves: Extraocular movements are full. Pupils are equal, round, and reactive to light and accomodation.  Visual fields are full.  Facial symmetry is present. She notes irritating increased sensation to temperature in the left face.   Frontal bone tuning fork  felt more  intense on the left (and more bothersome) compared to the right. .Facial strength is normal.  Trapezius and sternocleidomastoid strength is normal. No dysarthria is noted.  The tongue is midline, and the patient has symmetric elevation of the soft palate. No obvious hearing deficits are noted.  Motor:  Muscle bulk is normal.   Tone is normal. Strength is  5 / 5 in all 4 extremities.   Sensory: Sensory testing is intact to pinprick, soft touch and vibration sensation in right arm an leg but all modalities reduced on the left.    Coordination: Cerebellar testing reveals good finger-nose-finger and heel-to-shin bilaterally.  Gait and station: Station is normal.   Gait is normal. Tandem gait is normal. Romberg is negative.   Reflexes: Deep tendon reflexes are symmetric and normal bilaterally.   Plantar responses are flexor.    DIAGNOSTIC DATA (LABS, IMAGING, TESTING) - I reviewed patient records, labs, notes, testing and imaging myself where available.  Lab Results  Component Value Date   WBC 5.0 06/08/2017   HGB 13.0 06/08/2017   HCT 41.2 06/08/2017   MCV 93.4 06/08/2017   PLT 313 06/08/2017      Component Value Date/Time   NA 135 06/08/2017 1308   K 3.7 06/08/2017 1308   CL 102 06/08/2017 1308   CO2 24 06/08/2017 1308   GLUCOSE 95 06/08/2017 1308   BUN 8 06/08/2017 1308   CREATININE 0.76 06/08/2017 1308   CALCIUM 8.6 (L) 06/08/2017 1308   PROT 7.4 06/22/2015 1025   ALBUMIN 3.9 06/22/2015 1025   AST 19 06/22/2015 1025   ALT 27 06/22/2015 1025   ALKPHOS 138 (H) 06/22/2015 1025   BILITOT 0.6 06/22/2015 1025   GFRNONAA >60 06/08/2017 1308   GFRAA >60 06/08/2017 1308   No results found for: CHOL, HDL, LDLCALC, LDLDIRECT, TRIG, CHOLHDL No results found for: WUJW1X No results found for: VITAMINB12 Lab Results  Component Value Date   TSH 1.555 05/14/2012       ASSESSMENT AND PLAN  Left sided numbness  Dysesthesia  Chronic migraine  In summary, Ms. Potenza is a  33 year old woman who has had chronic headache with some migrainous features since April 2019.  She has 30/30 headache days a month.  Additionally, she reports decreased sensation in the left arm and leg and altered sensation in the left face.  The etiology of the numbness is unclear as she does not have any abnormality in the brain or spinal cord.  The differences in perception of the tuning fork slightly left of midline and slightly right of midline might imply some functional component to the numbness.  I also do not have an explanation for her urinary or fecal incontinence.  With a normal spinal cord and brain, a neurologic etiology is unlikely.  To help with the headaches, I gave her samples of Emgality to take the loading dose (240 mg) and a prescription was sent in for the monthly maintenance dose.  She will return to see our office in about 3 to 4 months or sooner if there are new or worsening neurologic symptoms.  Thank you for asking me to see Ms. Lorenda Hatchet.  Please let me know if I can be of further assistance with her or other patients in the future.   Donnella Morford A. Epimenio Foot, MD, [facility] 03/16/2018, 12:16 PM Certified in Neurology, Clinical Neurophysiology, Sleep Medicine, Pain Medicine and Neuroimaging  [facility] [address] [place], [place] [address] [phone]

## 2018-05-11 IMAGING — MG DIGITAL DIAGNOSTIC BILATERAL MAMMOGRAM WITH TOMO AND CAD
6 of 10 series · 6 of 30 positions shown · non-contrast
Comparison: None.

CLINICAL DATA: 32-year-old female with 4 week history of pain
involving the outer right breast.

EXAM:
DIGITAL DIAGNOSTIC BILATERAL MAMMOGRAM WITH CAD AND TOMO
RIGHT BREAST ULTRASOUND

[R CC]
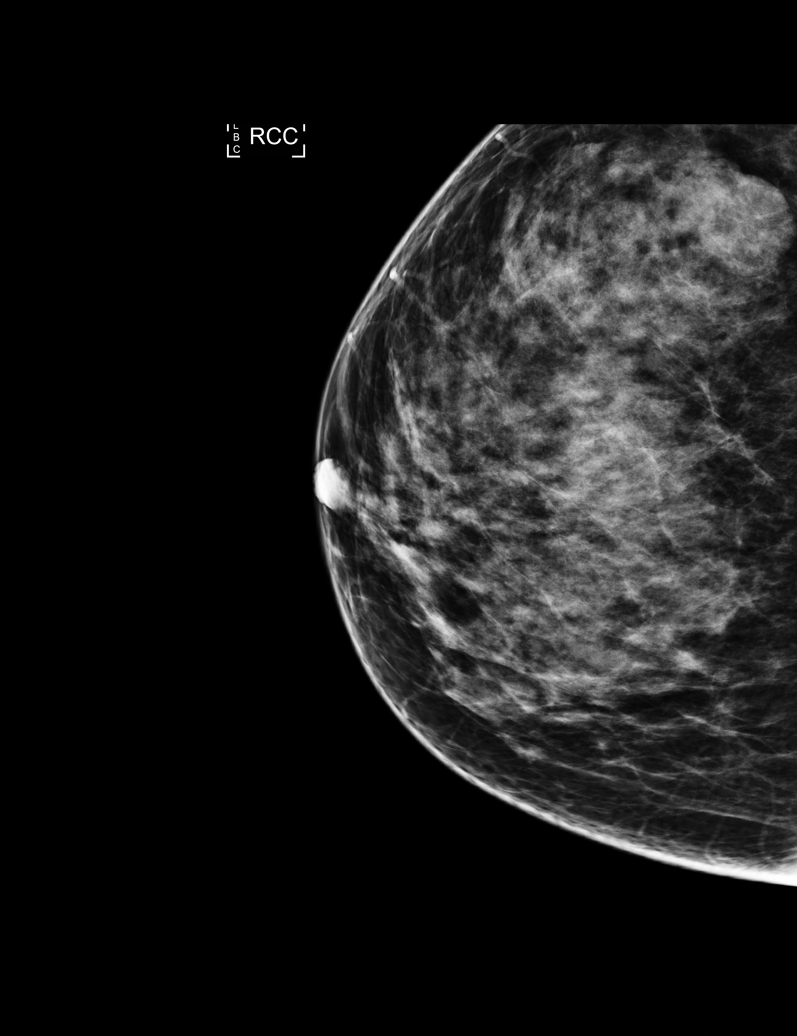

[L CC]
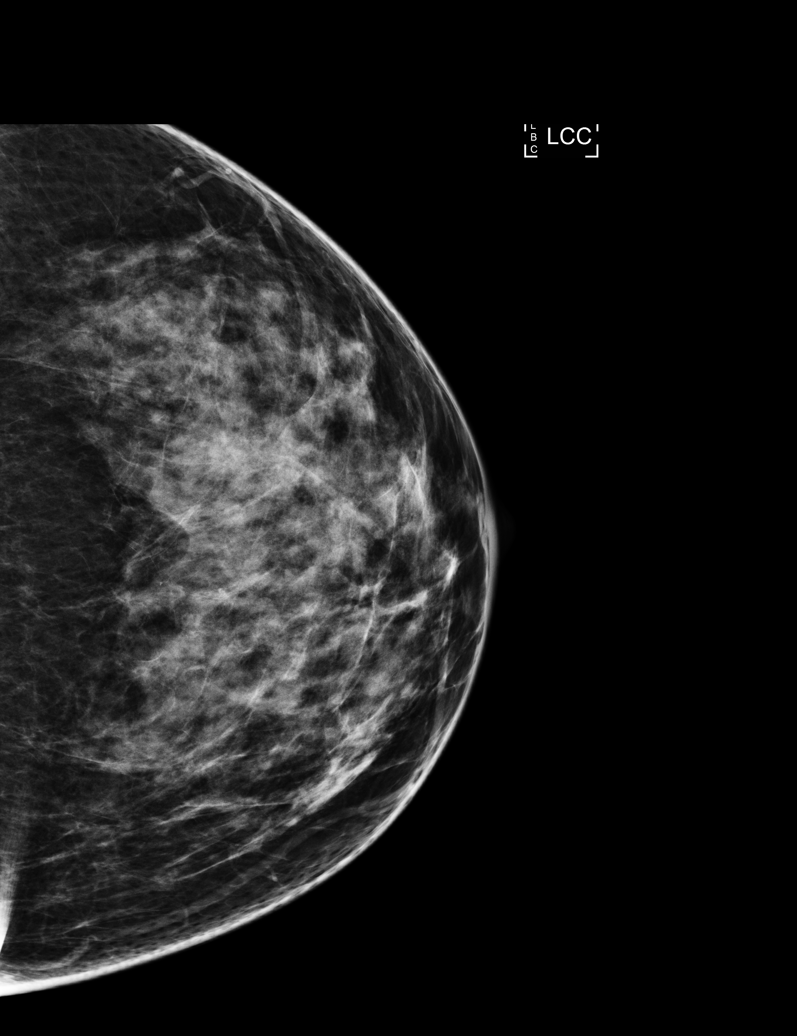

[R XCCL]
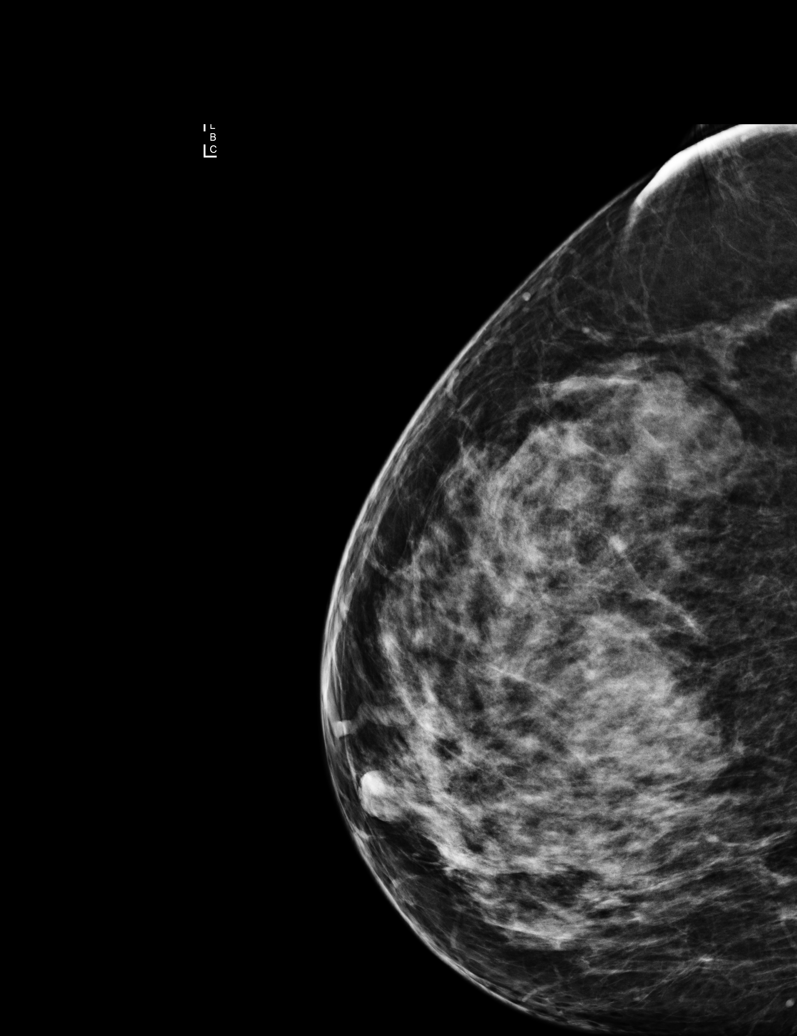

[L MLO]
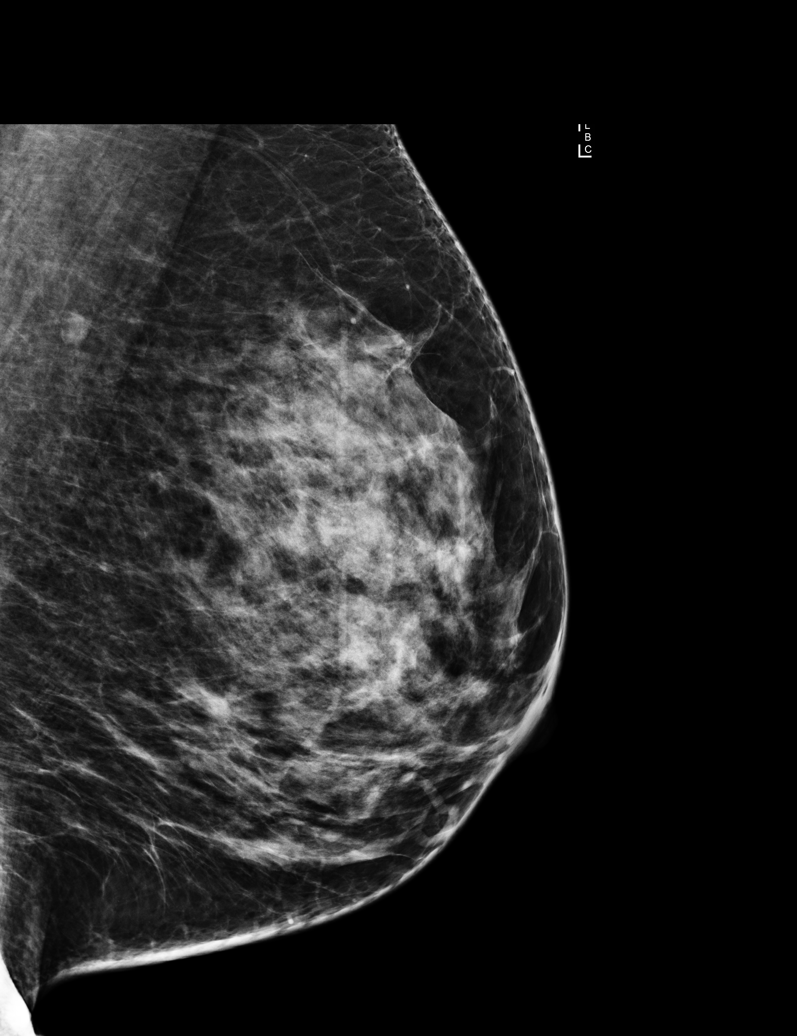

[R MLO]
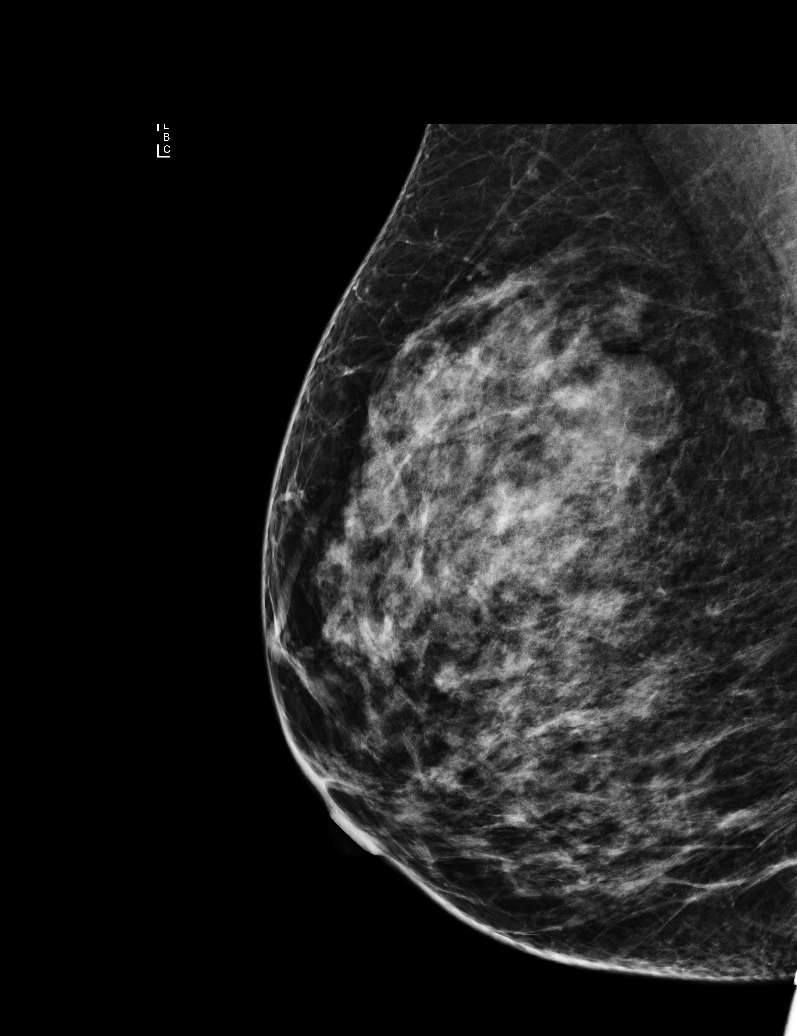

[L MLO tomo · tomo slice 34/67.0]
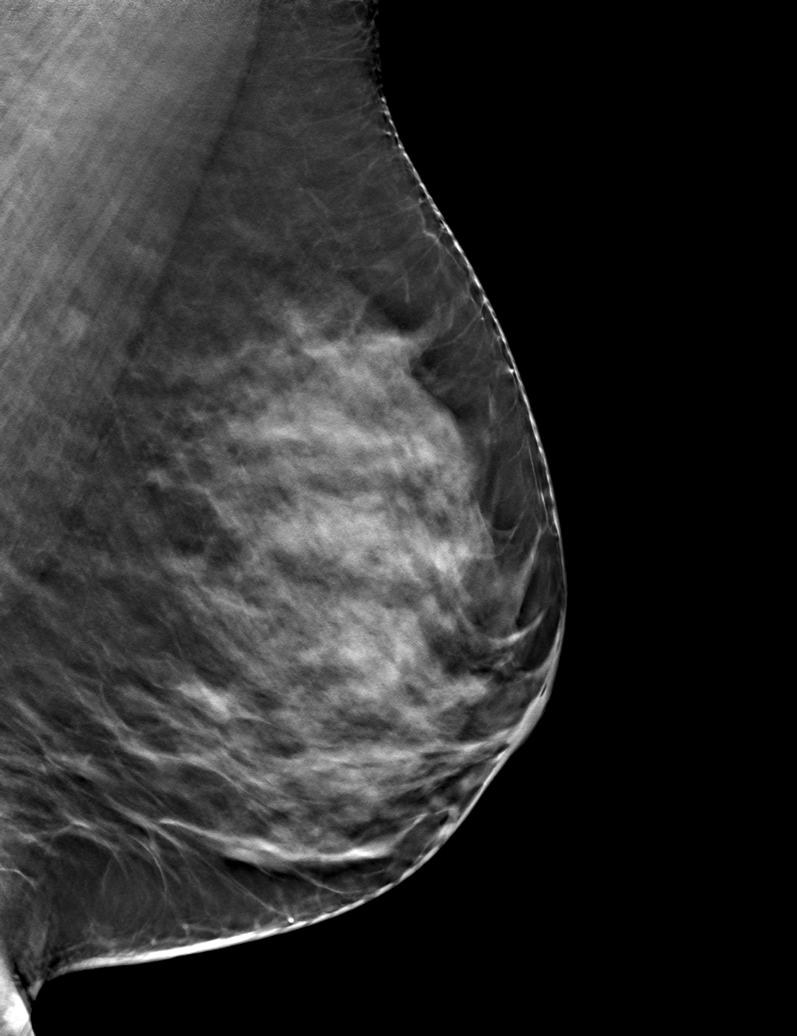

[6 of 30 positions shown; findings below may reference images not displayed]

ACR Breast Density Category c: The breast tissue is heterogeneously
dense, which may obscure small masses.
FINDINGS: There is an oval circumscribed mass in the upper-outer left breast
measuring approximately 2.5 cm. No suspicious masses or
calcifications are seen in the left breast.

Mammographic images were processed with CAD.

Physical examination of the upper-outer right breast in the region
of pain reveals thickening, however no definite underlying palpable
mass.

Targeted ultrasound of the upper-outer right breast was performed
demonstrating a cyst at 10 o'clock 4 cm from nipple measuring 2.3 x
0.9 x 2.7 cm. This corresponds well with the mass seen in the
upper-outer right breast at mammography. This mid may account for
tenderness involving the outer right breast as well. No suspicious
masses or abnormalities identified in the outer right breast.
IMPRESSION: 1. Benign cyst in the region of tenderness in the upper-outer right
breast.

2.  No findings of malignancy in either breast.

RECOMMENDATION:
Screening mammogram at age 39 unless there are persistent or
intervening clinical concerns. (Code:Z2-1-VS2)

I have discussed the findings and recommendations with the patient.
Results were also provided in writing at the conclusion of the
visit. If applicable, a reminder letter will be sent to the patient
regarding the next appointment.

BI-RADS CATEGORY  2: Benign.

## 2018-06-14 ENCOUNTER — Telehealth: Payer: Self-pay | Admitting: *Deleted

## 2018-06-14 ENCOUNTER — Encounter: Payer: Self-pay | Admitting: *Deleted

## 2018-06-14 NOTE — Telephone Encounter (Signed)
I called pt,  She consented to VIRTUAL VISIT WEBEX. Due to current COVID 19 pandemic, our office is severely reducing in office visits for at least the next 2 weeks, in order to minimize the risk to our patients and healthcare providers.  Pt understands that although there may be some limitations with this type of visit, we will take all precautions to reduce any security or privacy concerns.  Pt understands that this will be treated like an in office visit and we will file with pt's insurance. Pt's email is [email or web address]. Pt understands that the cis[employer] webex software must be downloaded and operational on the device pt plans to use for the visit.  I updated her chart.

## 2018-06-15 ENCOUNTER — Telehealth: Payer: Self-pay | Admitting: Family Medicine

## 2018-06-15 ENCOUNTER — Other Ambulatory Visit: Payer: Self-pay

## 2018-06-15 ENCOUNTER — Ambulatory Visit: Payer: BLUE CROSS/BLUE SHIELD | Admitting: Family Medicine

## 2018-06-15 NOTE — Telephone Encounter (Signed)
I have attempted to reach patient via video Webex meeting and by phone. Patient not on meeting and phone call goes straight to voicemail. I have left message to return call. Will reschedule at that time.

## 2018-06-15 NOTE — Progress Notes (Deleted)
PATIENT: [name] DOB: 13-Oct-1985  REASON FOR VISIT: follow up HISTORY FROM: patient  Virtual Visit via Telephone Note  I connected with [name] on 06/15/18 at 11:00 AM EDT by telephone and verified that I am speaking with the correct person using two identifiers.   I discussed the limitations, risks, security and privacy concerns of performing an evaluation and management service by telephone and the availability of in person appointments. I also discussed with the patient that there may be a patient responsible charge related to this service. The patient expressed understanding and agreed to proceed.   History of Present Illness:  06/15/18 [name] is a 33 y.o. female for follow up of migraines. She was started on Emgality at her visit with Dr Epimenio Foot in 02/2018.    HISTORY (copied from Dr Bonnita Hollow note on 03/16/2018)  I had the pleasure seeing your patient, [name], at [facility] for a neurologic consultation regarding her headache, neck pain and left-sided numbness.  She is a 33 year old woman who had the onset of headache 06/08/2017 with pain in the left occiput and nausea.   She took an oxycodone but had no benefit.   The next day the headache persisted and she had left sided numbness in the arm and leg.  She was also was sweating profusely.    [facility]   She had a series of multiple injections without benefit.  She was also placed on Quedexy XR 225 and Reglan prn.    This did not help much and headaches persisted, often associated with left sided numbness.  She saw Dr. Malvin Johns and Ms. Lindi Adie, NP at [facility] in [place].   She was placed her on nortriptyline.  Initially, she overlapped with Qudexy but then because of side effects stop the Qudexy and stayed on the nortriptyline.  This helped slightly at 50 mg nightly.     She has not been on any of the anti-CGRP agents.  Currently, her  headache is located over the left convexity and she has left > right occipital tenderness.   She is not on any medication.    She also notes a mental fog.  The numbness on the left side of her body persist, similar to several months ago.  She notes photophobia and phonophobia.   She has nausea but not vomiting.     Moving the head sometimes worsens the pain.   She has had difficulty with bladder and bowel control with episodes of incontinence.   These episodes are worse when the headache is more intense.  I personally reviewed the MRI of the brain 07/29/17 and cervical spine 01/22/2018.  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  The sella turcica is slightly enlarged though pituitary tissue is not flattened so this is likely a normal variant.  The MRI of the cervical spine shows a very small disc bulge at C5-C6 and is otherwise normal.  The spinal cord is normal.  There is no nerve root compression or spinal stenosis.     I also reviewed the nerve conduction and EMG by Dr. Malvin Johns performed 09/22/2017.  He felt she might have a very mild mid cervical polyradiculopathy based on right cervical paraspinal muscles showing some polyphasic units.  All of the muscles in the left arm were normal.  The nerve conductions were normal.  Notes from [facility] neurology [place] reviewed  and summarized above.   Observations/Objective:  Generalized: Well developed, in no acute distress  Mentation: Alert oriented to time, place, history taking. Follows all commands speech and language fluent   Assessment and Plan:  33 y.o. year old female  has a past medical history of Abnormal Pap smear, chlamydia infection, gonorrhea, Incompetent cervix in pregnancy, Left sided abdominal pain (09/15/2013), Migraines, No pertinent past medical history, Positive fecal occult blood test (09/15/2013), Rectal bleeding (09/15/2013), and Vaginal Pap smear, abnormal. here with    ICD-10-CM   1. Chronic  migraine G43.709     No orders of the defined types were placed in this encounter.   No orders of the defined types were placed in this encounter.    Follow Up Instructions:  I discussed the assessment and treatment plan with the patient. The patient was provided an opportunity to ask questions and all were answered. The patient agreed with the plan and demonstrated an understanding of the instructions.   The patient was advised to call back or seek an in-person evaluation if the symptoms worsen or if the condition fails to improve as anticipated.  I provided 20 minutes of non-face-to-face time during this encounter.  Patient is located at her place of residence during video conference.  Provider is located at her place of residence.  Alverda SkeansSandy Young, RN helped to facilitate visit.   Shawnie DapperAmy Milford Cilento, NP

## 2018-06-16 NOTE — Telephone Encounter (Signed)
LMVM for pt to return call, to get rescheduled as did not connect with Amy, NP yesterday.

## 2018-07-31 ENCOUNTER — Emergency Department (HOSPITAL_COMMUNITY)
Admission: EM | Admit: 2018-07-31 | Discharge: 2018-08-01 | Disposition: A | Discharge status: Other Acute Inpatient Hospital | Payer: BC Managed Care – PPO | Attending: Emergency Medicine | Admitting: Emergency Medicine

## 2018-07-31 ENCOUNTER — Other Ambulatory Visit: Payer: Self-pay

## 2018-07-31 ENCOUNTER — Encounter (HOSPITAL_COMMUNITY): Payer: Self-pay | Admitting: Emergency Medicine

## 2018-07-31 DIAGNOSIS — Z03818 Encounter for observation for suspected exposure to other biological agents ruled out: Secondary | ICD-10-CM | POA: Diagnosis not present

## 2018-07-31 DIAGNOSIS — E876 Hypokalemia: Secondary | ICD-10-CM | POA: Insufficient documentation

## 2018-07-31 DIAGNOSIS — Z1159 Encounter for screening for other viral diseases: Secondary | ICD-10-CM | POA: Diagnosis not present

## 2018-07-31 DIAGNOSIS — Z7289 Other problems related to lifestyle: Secondary | ICD-10-CM

## 2018-07-31 DIAGNOSIS — F329 Major depressive disorder, single episode, unspecified: Secondary | ICD-10-CM | POA: Diagnosis not present

## 2018-07-31 DIAGNOSIS — Z79899 Other long term (current) drug therapy: Secondary | ICD-10-CM | POA: Insufficient documentation

## 2018-07-31 DIAGNOSIS — R45851 Suicidal ideations: Secondary | ICD-10-CM | POA: Diagnosis not present

## 2018-07-31 DIAGNOSIS — X788XXA Intentional self-harm by other sharp object, initial encounter: Secondary | ICD-10-CM | POA: Diagnosis not present

## 2018-07-31 DIAGNOSIS — F322 Major depressive disorder, single episode, severe without psychotic features: Secondary | ICD-10-CM | POA: Insufficient documentation

## 2018-07-31 LAB — COMPREHENSIVE METABOLIC PANEL
ALT: 23 U/L (ref 0–44)
AST: 18 U/L (ref 15–41)
Albumin: 3.9 g/dL (ref 3.5–5.0)
Alkaline Phosphatase: 120 U/L (ref 38–126)
Anion gap: 9 (ref 5–15)
BUN: 7 mg/dL (ref 6–20)
CO2: 22 mmol/L (ref 22–32)
Calcium: 9 mg/dL (ref 8.9–10.3)
Chloride: 105 mmol/L (ref 98–111)
Creatinine, Ser: 0.65 mg/dL (ref 0.44–1.00)
GFR calc Af Amer: >60 mL/min (ref >60)
GFR calc non Af Amer: >60 mL/min (ref >60)
Glucose, Bld: 99 mg/dL (ref 70–99)
Potassium: 3.2 mmol/L — ABNORMAL LOW (ref 3.5–5.1)
Sodium: 136 mmol/L (ref 135–145)
Total Bilirubin: 0.6 mg/dL (ref 0.3–1.2)
Total Protein: 7.7 g/dL (ref 6.5–8.1)

## 2018-07-31 LAB — I-STAT BETA HCG BLOOD, ED (MC, WL, AP ONLY): I-stat hCG, quantitative: <5 m[IU]/mL (ref <5)

## 2018-07-31 LAB — RAPID URINE DRUG SCREEN, HOSP PERFORMED
Amphetamines: NOT DETECTED
Barbiturates: NOT DETECTED
Benzodiazepines: NOT DETECTED
Cocaine: NOT DETECTED
Opiates: NOT DETECTED
Tetrahydrocannabinol: NOT DETECTED

## 2018-07-31 LAB — ACETAMINOPHEN LEVEL: Acetaminophen (Tylenol), Serum: <10 ug/mL — ABNORMAL LOW (ref 10–30)

## 2018-07-31 LAB — CBC
HCT: 41.8 % (ref 36.0–46.0)
Hemoglobin: 13.4 g/dL (ref 12.0–15.0)
MCH: 29.2 pg (ref 26.0–34.0)
MCHC: 32.1 g/dL (ref 30.0–36.0)
MCV: 91.1 fL (ref 80.0–100.0)
Platelets: 367 10*3/uL (ref 150–400)
RBC: 4.59 MIL/uL (ref 3.87–5.11)
RDW: 12.7 % (ref 11.5–15.5)
WBC: 7 10*3/uL (ref 4.0–10.5)
nRBC: 0 % (ref 0.0–0.2)

## 2018-07-31 LAB — SALICYLATE LEVEL: Salicylate Lvl: <7 mg/dL (ref 2.8–30.0)

## 2018-07-31 LAB — ETHANOL: Alcohol, Ethyl (B): <10 mg/dL (ref <10)

## 2018-07-31 MED ORDER — POTASSIUM CHLORIDE CRYS ER 20 MEQ PO TBCR
40.0000 meq | EXTENDED_RELEASE_TABLET | Freq: Once | ORAL | Status: AC
  Administered 2018-07-31: 40 meq via ORAL
  Filled 2018-07-31: qty 2

## 2018-07-31 MED ORDER — ZIPRASIDONE MESYLATE 20 MG IM SOLR
10.0000 mg | Freq: Once | INTRAMUSCULAR | Status: AC
  Administered 2018-07-31: 10 mg via INTRAMUSCULAR
  Filled 2018-07-31: qty 20

## 2018-07-31 MED ORDER — STERILE WATER FOR INJECTION IJ SOLN
INTRAMUSCULAR | Status: AC
  Administered 2018-07-31: 23:00:00
  Filled 2018-07-31: qty 10

## 2018-07-31 NOTE — ED Notes (Signed)
Pt changing into purple scrubs now.  

## 2018-07-31 NOTE — ED Notes (Signed)
Pt wanded and all clear.

## 2018-07-31 NOTE — ED Notes (Signed)
Pt attempting to leave at this time. Security called.

## 2018-07-31 NOTE — ED Notes (Signed)
Belongings were taken and at nurse sta labeled.

## 2018-07-31 NOTE — ED Notes (Addendum)
Pt refusing to redress out in burgundy scrubs. Pt refusing to talk to this RN. Pt refusing to allow this RN to collect labs at this time. Door open and pt sitting on the bed. EDP notified.

## 2018-07-31 NOTE — ED Provider Notes (Signed)
Medical screening examination/treatment/procedure(s) were conducted as a shared visit with non-physician practitioner(s) and myself.  I personally evaluated the patient during the encounter.    PA-C Ford first reviewed the patient's case with me as we discussed her history of present illness and concerns for proceeding with IVC.  She had history of the patient expressing depression for 3 weeks with intermittent suicidal ideations.  She reported however that the patient was reticent to give details and would not elaborate on specific problems that she was having or her plan.  She reported that at times during the interview she became very tearful  Shortly after this encounter the patient came out of her room and insisted that she wanted to leave the hospital and did not want to pursue any further treatment.  She was very adamant.  Spent much time trying to talk and eluate the patient.  She was resentful and agitated.  Her mental status was clear however.  She had full capacity for providing history.  As I was trying to reassure her and more information, I observed she had a laceration to her wrist consistent with cutting.  She would not answer any questions about that.  I asked her directly if she cut herself and her intent.  She would not answer me and only looked away.  Patient was insistent that she needed to get home to her 3 children.  She would not tell me how old her children were and advised me that most of the questions asked were "none of my business".  She told me that her children are being looked after by her sister right now.  She reported that none of them knew that she was here at the hospital.  I asked for permission to talk to her sister which she did not grant.  The patient is alert and appropriate.  She is ambulatory without difficulty.  All of her movements are coordinated purposeful symmetric.  This time I do not see any indication of medical illness or acute psychosis.  After spending  significant time trying to fully interview the patient, I determined most appropriate course of action was to proceed with IVC.  I had significant concern for patient who presents with 3 weeks of depression and endorses having had suicidal ideation but is tearful at times and then guarded.  She had physical evidence of self injury behavior and would not elaborate on what happened.  I felt risk of discharge without psychiatric evaluation was too high.  Against patient's will, proceeded with IVC   Charlesetta Shanks, MD 08/01/18 1549

## 2018-07-31 NOTE — ED Triage Notes (Signed)
Patient arrived with EMS from home reports depression and suicidal ideation for several months but did not specify plan of suicide , no hallucinations or delusions . Maroon paper scrubs given to pt. / wanded by security.

## 2018-07-31 NOTE — ED Notes (Signed)
ETOH will be sent after I-Stat ran

## 2018-07-31 NOTE — ED Provider Notes (Signed)
[facility] EMERGENCY DEPARTMENT Provider Note   CSN: [id number] Arrival date & time: 07/31/18  1907    History   Chief Complaint Chief Complaint  Patient presents with   Suicidal    HPI SHANVI MOYD is a 33 y.o. female.     ARYANA WONNACOTT is a 33 y.o. female with a history of migraines and pelvic pain, who presents to the emergency department for evaluation of depression and suicidal ideation.  Patient reports worsening suicidal ideations over the past 3 weeks, she denies specific plan.  Denies HI or AVH.  I was notified by nursing staff prior to my evaluation the patient requesting to leave, I went in to talk with the patient and initially she was resistant to providing any information.  Reports that she has been very stressed at home and has had worsening depression and suicidal ideations.  She attempted to find a counselor to speak to through work, but has not yet seen them for an appointment.  Patient reports she lives at home with her husband and children, but reports her family does not know that she is here today.  Patient was tearful and reported that she frequently feels like she is the one helping everyone but feels like she needs some help.  Despite telling me these things patient is repeatedly asking if she can leave and stating that she just wants to go home, she is gotten dressed again.  She is unable to tell me specifically her plan for safety over the weekend but is repeatedly asking to leave.  She is unwilling to answer the majority of my questions.  Level 5 caveat: Psychiatric evaluation, patient uncooperative     Past Medical History:  Diagnosis Date   Abnormal Pap smear    Hx of chlamydia infection    Hx of gonorrhea    Incompetent cervix in pregnancy    Left sided abdominal pain 09/15/2013   Migraines    No pertinent past medical history    Positive fecal occult blood test 09/15/2013   Rectal bleeding 09/15/2013   Vaginal Pap  smear, abnormal     Patient Active Problem List   Diagnosis Date Noted   Left sided numbness 03/16/2018   Dysesthesia 03/16/2018   Chronic migraine 03/16/2018   Status post hysterectomy 06/26/2015   ASCUS with positive high risk HPV 06/06/2015   Dyspareunia, female 06/06/2015   Headache associated with sexual activity 03/20/2014   Pelvic pain in female 03/20/2014   Coital headache 02/24/2014   Pelvic pain affecting pregnancy 02/24/2014   Rectal bleeding 09/15/2013   Left sided abdominal pain 09/15/2013   Positive fecal occult blood test 09/15/2013   Hx of abnormal Pap smear 06/17/2012    Past Surgical History:  Procedure Laterality Date   ABDOMINAL HYSTERECTOMY N/A 06/26/2015   Procedure: HYSTERECTOMY ABDOMINAL;  Surgeon: Jonnie Kind, MD;  Location: [facility];  Service: Gynecology;  Laterality: N/A;  procedure 1   BILATERAL SALPINGECTOMY Bilateral 06/26/2015   Procedure: BILATERAL SALPINGECTOMY;  Surgeon: Jonnie Kind, MD;  Location: [facility];  Service: Gynecology;  Laterality: Bilateral;   BREAST SURGERY Left    benign cyst   CERVICAL BIOPSY     CERVICAL CERCLAGE N/A 2006   CESAREAN SECTION N/A 05/16/2012   Procedure: Primary cesarean section with delivery of baby girl at 31. Apgars 8/9.;  Surgeon: Guss Bunde, MD;  Location: [place] [facility];  Service: Obstetrics;  Laterality: N/A;   DILATION AND CURETTAGE OF UTERUS  ECTOPIC PREGNANCY SURGERY Left 2013   LAPAROSCOPY FOR ECTOPIC PREGNANCY  03/10/2011   Left Linear Salpingostomy,    REMOVAL OF NON VAGINAL CONTRACEPTIVE DEVICE N/A 06/26/2015   Procedure: Nexplanon Removal;  Surgeon: Tilda BurrowJohn Ferguson V, MD;  Location: [facility];  Service: Gynecology;  Laterality: N/A;   TONSILLECTOMY       OB History    Gravida  9   Para  3   Term      Preterm  3   AB  6   Living  3     SAB  5   TAB      Ectopic  1   Multiple      Live Births  3            Home Medications    Prior to Admission  medications   Medication Sig Start Date End Date Taking? Authorizing Provider  acyclovir (ZOVIRAX) 400 MG tablet TAKE (1) TABLET BY MOUTH TWICE DAILY. Patient taking differently: TAKE (1) TABLET BY MOUTH TWICE DAILY PRN 03/11/17   Tilda BurrowFerguson, John V, MD  diphenhydrAMINE (BENADRYL) 25 MG tablet Take 50 mg by mouth every 6 (six) hours as needed for allergies or sleep.    [provider]  Galcanezumab-gnlm (EMGALITY) 120 MG/ML SOAJ Inject 1 pen into the skin every 28 (twenty-eight) days. 03/16/18   Sater, Pearletha Furlichard A, MD  metoCLOPramide (REGLAN) 10 MG tablet 10 mg once a week.  06/15/17   [provider]    Family History Family History  Problem Relation Age of Onset   Asthma Sister    Allergies Sister    Eczema Sister    Asthma Brother    Allergies Brother    Eczema Brother    Asthma Daughter    Allergies Daughter    Eczema Daughter    Thyroid disease Maternal Grandmother        had thyroid removed   Cancer Maternal Grandfather        lung   Cancer Paternal Grandmother        ovarian,stomach   Cancer Paternal Grandfather        prostate   Allergies Daughter    Eczema Daughter    Endometriosis Other    Diabetes Other    Hypertension Other    Cancer Other        colon and ovarian     Social History Social History   Tobacco Use   Smoking status: Never Smoker   Smokeless tobacco: Never Used  Substance Use Topics   Alcohol use: Yes    Comment: occasionally   Drug use: No     Allergies   Alprazolam and Latex   Review of Systems Review of Systems  Unable to perform ROS: Psychiatric disorder     Physical Exam Updated Vital Signs BP (!) 152/78 (BP Location: Right Arm)    Pulse 90    Temp 98.9 F (37.2 C) (Oral)    Resp 16    LMP 08/01/2014    SpO2 100%   Physical Exam Vitals signs and nursing note reviewed.  Constitutional:      General: She is not in acute distress.    Appearance: Normal appearance. She is well-developed  and normal weight. She is not ill-appearing or diaphoretic.     Comments: Patient tearful, standing in doorway of room, asking to leave.  HENT:     Head: Normocephalic and atraumatic.  Eyes:     General:  Right eye: No discharge.        Left eye: No discharge.     Pupils: Pupils are equal, round, and reactive to light.  Neck:     Musculoskeletal: Neck supple.  Cardiovascular:     Rate and Rhythm: Normal rate and regular rhythm.     Heart sounds: Normal heart sounds.  Pulmonary:     Effort: Pulmonary effort is normal. No respiratory distress.     Breath sounds: Normal breath sounds. No wheezing or rales.  Abdominal:     General: Bowel sounds are normal. There is no distension.     Palpations: Abdomen is soft. There is no mass.     Tenderness: There is no abdominal tenderness. There is no guarding.  Musculoskeletal:        General: No deformity.     Comments: Superficial laceration to the inside of the wrist, no active bleeding  Skin:    General: Skin is warm and dry.     Capillary Refill: Capillary refill takes less than 2 seconds.  Neurological:     Mental Status: She is alert.     Coordination: Coordination normal.     Comments: Speech is clear, able to follow commands Moves extremities without ataxia, coordination intact  Psychiatric:        Attention and Perception: She does not perceive auditory or visual hallucinations.        Mood and Affect: Mood is depressed. Affect is tearful.        Speech: She is noncommunicative.        Behavior: Behavior is uncooperative.        Thought Content: Thought content includes suicidal ideation. Thought content does not include homicidal ideation. Thought content does not include suicidal plan.      ED Treatments / Results  Labs (all labs ordered are listed, but only abnormal results are displayed) Labs Reviewed  COMPREHENSIVE METABOLIC PANEL - Abnormal; Notable for the following components:      Result Value   Potassium  3.2 (*)    All other components within normal limits  ACETAMINOPHEN LEVEL - Abnormal; Notable for the following components:   Acetaminophen (Tylenol), Serum <10 (*)    All other components within normal limits  SARS CORONAVIRUS 2  ETHANOL  CBC  RAPID URINE DRUG SCREEN, HOSP PERFORMED  SALICYLATE LEVEL  I-STAT BETA HCG BLOOD, ED ([facility], [facility], [facility] ONLY)    EKG None  Radiology No results found.  Procedures Procedures (including critical care time)  Medications Ordered in ED Medications  ziprasidone (GEODON) injection 10 mg (10 mg Intramuscular Given 07/31/18 2310)  potassium chloride SA (K-DUR) CR tablet 40 mEq (40 mEq Oral Given 07/31/18 2315)  sterile water (preservative free) injection (  Given 07/31/18 2304)     Initial Impression / Assessment and Plan / ED Course  I have reviewed the triage vital signs and the nursing notes.  Pertinent labs & imaging results that were available during my care of the patient were reviewed by me and considered in my medical decision making (see chart for details).  Patient presents for suicidal ideations, initially requesting to leave but after speaking with the patient she is unable to contract for safety and has reported worsening suicidal ideations over the past 3 weeks.  She is not willing to provide much history, reports that her husband and family members do not know that she is here and I am concerned she does not have an adequate support system at home.  She is repeatedly asking to leave but is unable to tell me where she plans to go this weekend and I am concerned she could be a danger to herself.  Discussed with Dr. Clarice PolePfeifer and patient was placed under IVC.  When patient was informed of this she became very upset and agitated, yelling at myself and nursing staff in the hallway, repeatedly stating "I am not crazy".  She tried to leave the department and security assisted in escorting patient back to her room.  Patient is unwilling to speak to  myself anymore regarding her suicidal thoughts.  Medical clearance labs ordered.  Patient is refusing to change into burgundy scrubs at this time.  She is also refusing to cooperate with nursing staff.  Patient has a superficial laceration noted to the inside of her wrist and I do feel she is likely a danger to herself.  TTS consult placed.  Medical clearance labs unremarkable aside from mild hypokalemia of 3.2, p.o. potassium given, negative UDS and negative ethanol, salicylate and acetaminophen levels.  TTS has seen and evaluated the patient and they are recommending inpatient treatment and she has been accepted to the behavioral health crisis center pending coronavirus test, patient continues to refuse coronavirus test and refused to dress out into scrubs.  Discussed with Dr. Clarice PolePfeifer and will give dose of IM Geodon for agitation and compliance.  Coronavirus test pending, as long as this is negative, patient is medically cleared for transfer to crisis center.  Final Clinical Impressions(s) / ED Diagnoses   Final diagnoses:  Suicidal ideation  Self-injurious behavior  Hypokalemia    ED Discharge Orders    None       Legrand RamsFord, Orbin Mayeux N, PA-C 08/01/18 Claiborne Billings0021    Pfeiffer, Marcy, MD 08/01/18 1551
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
## 2018-07-31 NOTE — ED Notes (Addendum)
Pt came to the nursing station asking for an RN to come into the room. This RN went into pt room and pt asking for her belongings stating she is leaving. Pt encouraged to stay. Pt adamant about leaving. EDPA made aware.

## 2018-07-31 NOTE — ED Notes (Signed)
Patient given Geodon IM for agitation/non compliance , nurse explained plan of care to pt. , respirations unlabored / denies pain .

## 2018-08-01 ENCOUNTER — Observation Stay (HOSPITAL_COMMUNITY)
Admission: AD | Admit: 2018-08-01 | Discharge: 2018-08-01 | Disposition: A | Discharge status: Home / Self Care | Payer: BC Managed Care – PPO | Attending: Psychiatry | Admitting: Psychiatry

## 2018-08-01 DIAGNOSIS — Z91040 Latex allergy status: Secondary | ICD-10-CM | POA: Diagnosis not present

## 2018-08-01 DIAGNOSIS — F329 Major depressive disorder, single episode, unspecified: Secondary | ICD-10-CM | POA: Diagnosis not present

## 2018-08-01 DIAGNOSIS — Z888 Allergy status to other drugs, medicaments and biological substances status: Secondary | ICD-10-CM | POA: Diagnosis not present

## 2018-08-01 DIAGNOSIS — F4321 Adjustment disorder with depressed mood: Secondary | ICD-10-CM | POA: Diagnosis not present

## 2018-08-01 DIAGNOSIS — Z90710 Acquired absence of both cervix and uterus: Secondary | ICD-10-CM | POA: Insufficient documentation

## 2018-08-01 DIAGNOSIS — R45851 Suicidal ideations: Secondary | ICD-10-CM | POA: Insufficient documentation

## 2018-08-01 DIAGNOSIS — Z79899 Other long term (current) drug therapy: Secondary | ICD-10-CM | POA: Insufficient documentation

## 2018-08-01 LAB — SARS CORONAVIRUS 2: SARS Coronavirus 2: NOT DETECTED

## 2018-08-01 MED ORDER — ACETAMINOPHEN 325 MG PO TABS: 650.0000 mg | ORAL_TABLET | ORAL | Status: DC | PRN

## 2018-08-01 NOTE — ED Notes (Signed)
Patient refused to change to maroon scrubs despite multiple request by RN . Sitter at bedside .

## 2018-08-01 NOTE — ED Notes (Signed)
Pt. Belongings retrieved from security at this time for transport.

## 2018-08-01 NOTE — Discharge Summary (Addendum)
[place] Unit Discharge Summary   Patient admitted from [facility] due to concern regarding self harm. Patient seen with Dr. Darleene Cleaver upon arrival to the unit. The patient denied that she had intentionally self-harmed but reported "trying to deal with stress as the reason she came." Kortney reported her intention was "was just to speak with a counselor. So I came to the ED because I needed to talk to someone. Then they would not let me go home but sent me here. I have three children who need me. I need to get home. I have just been under enormous pressure between home and work. I will never do anything silly again. I just do not need to be here." Evaluated with Dr. Darleene Cleaver who also found the patient not to be a danger to herself. Patient found to be stable to follow up with an outpatient counselor. She did not require inpatient psychiatric care. Patient was in the [place] unit less than 24 hours. Patient seen face-to-face for psychiatric evaluation, chart reviewed and case discussed with the physician extender and developed treatment plan. Reviewed the information documented and agree with the treatment plan. Corena Pilgrim, MD

## 2018-08-01 NOTE — ED Provider Notes (Signed)
Pt under IVC for SI. Has a bed available at [facility], should be transferred shortly.    Franchot Heidelberg, PA-C 08/01/18 2800    Pattricia Boss, MD 08/01/18 [phone]
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
## 2018-08-01 NOTE — Progress Notes (Signed)
Patient ID: [name], female   DOB: [birth year], 33 y.o.   MRN: [id number] Discharge orders entered, pt educated on discharge instructions and verbalizes understanding. Transportation home to be provided by her husband and she denies having any current  Concerns.

## 2018-08-01 NOTE — ED Notes (Signed)
Patient wearing maroon paper scrubs , sitter at bedside , personal belongins bagged/labeled and stored at locker#6 purple pod, valuables locked at security safe . Sitter at bedside .

## 2018-08-01 NOTE — Progress Notes (Signed)
Patient ID: [name], female   DOB: [birth year], 33 y.o.   MRN: [id number] D: Patient is calm and cooperative, currently denies SI/HI/AVH, reports that being admitted was a mistake and that she wants to go home to be with her children.  Pt contracts for safety outside of the hospital and states that she had gone to [facility] hospital to look for resources and to talk to someone because she currently takes care of her three daughters and took care of her husband for several years and was overwhelmed by the fact that she has no one to talk to.  R: Pt being monitored for safety Q15 minutes.R: Will continue to monitor.

## 2018-08-01 NOTE — ED Notes (Addendum)
Pt has a bed and will be going to [facility] in the morning. [id number], can go to [facility] after 0730.

## 2018-08-01 NOTE — ED Notes (Signed)
Pt. At the desk making phone call.

## 2018-08-01 NOTE — ED Notes (Signed)
Pt. Still at desk attempting to call a family member at this time.

## 2018-08-01 NOTE — [facility] Suicide Risk Assessment (Cosign Needed)
Suicide Risk Assessment  Admission Assessment   [facility] Admission/Discharge Suicide Risk Assessment   Nursing information obtained from:    Demographic factors:    Current Mental Status:    Loss Factors:    Historical Factors:    Risk Reduction Factors:     Total Time spent with patient: 20 minutes Principal Problem: <principal problem not specified> Diagnosis:  Active Problems:   * No active hospital problems. *  Subjective Data: Patient made superficial cut to wrist due to personal stressors.  Continued Clinical Symptoms:    The "Alcohol Use Disorders Identification Test", Guidelines for Use in Primary Care, Second Edition.  World Pharmacologist [facility]). Score between 0-7:  no or low risk or alcohol related problems. Score between 8-15:  moderate risk of alcohol related problems. Score between 16-19:  high risk of alcohol related problems. Score 20 or above:  warrants further diagnostic evaluation for alcohol dependence and treatment.   CLINICAL FACTORS:   None   Musculoskeletal: Strength & Muscle Tone: within normal limits Gait & Station: normal Patient leans: N/A  Psychiatric Specialty Exam:     Last menstrual period 08/01/2014.There is no height or weight on file to calculate BMI.  General Appearance: Casual  Eye Contact:  Good  Speech:  Clear and Coherent  Volume:  Normal  Mood:  Anxious  Affect:  Appropriate  Thought Process:  Coherent and Goal Directed  Orientation:  Full (Time, Place, and Person)  Thought Content:  Symptoms, worries, concerns  Suicidal Thoughts:  No  Homicidal Thoughts:  No  Memory:  Immediate;   Good Recent;   Good Remote;   Good  Judgement:  Fair  Insight:  Present  Psychomotor Activity:  Normal  Concentration:  Concentration: Good and Attention Span: Good  Recall:  Good  Fund of Knowledge:  Good  Language:  Good  Akathisia:  No  Handed:  Right  AIMS (if indicated):     Assets:  Communication Skills Desire for  Improvement Financial Resources/Insurance Housing Intimacy Leisure Time Physical Health Resilience Social Support Talents/Skills Transportation Vocational/Educational  ADL's:  Intact  Cognition:  WNL  Sleep:         COGNITIVE FEATURES THAT CONTRIBUTE TO RISK:  None    SUICIDE RISK:   Minimal: No identifiable suicidal ideation.  Patients presenting with no risk factors but with morbid ruminations; may be classified as minimal risk based on the severity of the depressive symptoms  PLAN OF CARE: Patient to pursue outpatient psychotherapy to help her cope better with situational stressors.   I certify that [facility] Observation inpatient services furnished can reasonably be expected to improve the patient's condition.   Elmarie Shiley, NP 08/01/2018, 12:56 PM

## 2018-09-01 ENCOUNTER — Encounter: Payer: Self-pay | Admitting: Obstetrics and Gynecology

## 2018-09-01 ENCOUNTER — Ambulatory Visit (INDEPENDENT_AMBULATORY_CARE_PROVIDER_SITE_OTHER): Payer: BC Managed Care – PPO | Admitting: Obstetrics and Gynecology

## 2018-09-01 ENCOUNTER — Other Ambulatory Visit: Payer: Self-pay

## 2018-09-01 VITALS — BP 120/69 | HR 65 | Ht 66.0 in | Wt 210.8 lb

## 2018-09-01 DIAGNOSIS — N6310 Unspecified lump in the right breast, unspecified quadrant: Secondary | ICD-10-CM | POA: Diagnosis not present

## 2018-09-01 NOTE — Progress Notes (Signed)
Patient ID: SUSETTE SEMINARA, female   DOB: [birth year], 33 y.o.   MRN: [id number]    [facility] Visit  @DATE @            Patient name: [name] MRN [id number]  Date of birth: 12-26-1985  CC & HPI:  [name] is a 33 y.o. female presenting today for lump on breast. 2 weeks ago could see indention and protruding the size of a golf ball. Has diminished in sized from original eval .   ROS:  ROS +right breast cyst -breast drainage  All systems are negative except as noted in the HPI and PMH.   Pertinent History Reviewed:   Reviewed: Medical         Past Medical History:  Diagnosis Date  . Abnormal Pap smear   . Hx of chlamydia infection   . Hx of gonorrhea   . Incompetent cervix in pregnancy   . Left sided abdominal pain 09/15/2013  . Migraines   . No pertinent past medical history   . Positive fecal occult blood test 09/15/2013  . Rectal bleeding 09/15/2013  . Vaginal Pap smear, abnormal                               Surgical Hx:    Past Surgical History:  Procedure Laterality Date  . ABDOMINAL HYSTERECTOMY N/A 06/26/2015   Procedure: HYSTERECTOMY ABDOMINAL;  Surgeon: Jonnie Kind, MD;  Location: [facility];  Service: Gynecology;  Laterality: N/A;  procedure 1  . BILATERAL SALPINGECTOMY Bilateral 06/26/2015   Procedure: BILATERAL SALPINGECTOMY;  Surgeon: Jonnie Kind, MD;  Location: [facility];  Service: Gynecology;  Laterality: Bilateral;  . BREAST SURGERY Left    benign cyst  . CERVICAL BIOPSY    . CERVICAL CERCLAGE N/A 2006  . CESAREAN SECTION N/A 05/16/2012   Procedure: Primary cesarean section with delivery of baby girl at 99. Apgars 8/9.;  Surgeon: Guss Bunde, MD;  Location: [facility];  Service: Obstetrics;  Laterality: N/A;  . DILATION AND CURETTAGE OF UTERUS    . ECTOPIC PREGNANCY SURGERY Left 2013  . LAPAROSCOPY FOR ECTOPIC PREGNANCY  03/10/2011   Left Linear Salpingostomy,   . REMOVAL OF NON VAGINAL CONTRACEPTIVE DEVICE N/A 06/26/2015   Procedure:  Nexplanon Removal;  Surgeon: Jonnie Kind, MD;  Location: [facility];  Service: Gynecology;  Laterality: N/A;  . TONSILLECTOMY     Medications: Reviewed & Updated - see associated section                       Current Outpatient Medications:  .  diphenhydrAMINE (BENADRYL) 25 MG tablet, Take 50 mg by mouth every 6 (six) hours as needed for allergies or sleep., Disp: , Rfl:  .  metoCLOPramide (REGLAN) 10 MG tablet, 10 mg once a week. , Disp: , Rfl:    Social History: Reviewed -  reports that she has never smoked. She has never used smokeless tobacco.  Objective Findings:  Vitals: Last menstrual period 08/01/2014.  PHYSICAL EXAMINATION General appearance - alert, well appearing, and in no distress Mental status - alert, oriented to person, place, and time, normal mood, behavior, speech, dress, motor activity, and thought processes, affect appropriate to mood Breasts -  @ 10 clock on right breast 6 cm from areola smooth firm mobile non-tender cyst Left beast irregular in uoq, without distinct mass PELVIC DEFERRED  Assessment &  Plan:   A:  1.  Right brest cyst/ mass  P:  1.  Diagnostic mammogram w/ u/s @ [facility] to confirm dx.    By signing my name below, I, Arnette NorrisMari Johnson, attest that this documentation has been prepared under the direction and in the presence of Tilda BurrowFerguson, Linday Rhodes V, MD. Electronically Signed: Arnette NorrisMari Johnson Medical Scribe. 09/01/18. 2:44 PM.  I personally performed the services described in this documentation, which was SCRIBED in my presence. The recorded information has been reviewed and considered accurate. It has been edited as necessary during review. Tilda BurrowJohn V Berneice Zettlemoyer, MD

## 2018-09-03 ENCOUNTER — Other Ambulatory Visit (HOSPITAL_COMMUNITY): Payer: Self-pay | Admitting: Obstetrics and Gynecology

## 2018-09-03 DIAGNOSIS — N6310 Unspecified lump in the right breast, unspecified quadrant: Secondary | ICD-10-CM

## 2018-09-07 ENCOUNTER — Ambulatory Visit (HOSPITAL_COMMUNITY)
Admission: RE | Admit: 2018-09-07 | Discharge: 2018-09-07 | Disposition: A | Discharge status: Home / Self Care | Payer: BC Managed Care – PPO | Source: Ambulatory Visit | Attending: Obstetrics and Gynecology | Admitting: Obstetrics and Gynecology

## 2018-09-07 ENCOUNTER — Other Ambulatory Visit: Payer: Self-pay

## 2018-09-07 ENCOUNTER — Ambulatory Visit (HOSPITAL_COMMUNITY): Payer: BC Managed Care – PPO

## 2018-09-07 DIAGNOSIS — R922 Inconclusive mammogram: Secondary | ICD-10-CM | POA: Diagnosis not present

## 2018-09-07 DIAGNOSIS — N6310 Unspecified lump in the right breast, unspecified quadrant: Secondary | ICD-10-CM | POA: Insufficient documentation

## 2018-09-07 DIAGNOSIS — N6001 Solitary cyst of right breast: Secondary | ICD-10-CM | POA: Diagnosis not present

## 2018-09-13 ENCOUNTER — Other Ambulatory Visit: Payer: Self-pay | Admitting: Obstetrics & Gynecology

## 2018-09-13 MED ORDER — VALACYCLOVIR HCL 1 G PO TABS: 1000.0000 mg | ORAL_TABLET | Freq: Two times a day (BID) | ORAL | 11 refills | Status: DC

## 2018-10-03 ENCOUNTER — Encounter (HOSPITAL_COMMUNITY): Payer: Self-pay | Admitting: Emergency Medicine

## 2018-10-03 ENCOUNTER — Other Ambulatory Visit: Payer: Self-pay

## 2018-10-03 ENCOUNTER — Emergency Department (HOSPITAL_COMMUNITY)
Admission: EM | Admit: 2018-10-03 | Discharge: 2018-10-03 | Disposition: A | Discharge status: Home / Self Care | Payer: BC Managed Care – PPO | Attending: Emergency Medicine | Admitting: Emergency Medicine

## 2018-10-03 DIAGNOSIS — K645 Perianal venous thrombosis: Secondary | ICD-10-CM | POA: Insufficient documentation

## 2018-10-03 DIAGNOSIS — Z885 Allergy status to narcotic agent status: Secondary | ICD-10-CM | POA: Insufficient documentation

## 2018-10-03 DIAGNOSIS — K6289 Other specified diseases of anus and rectum: Secondary | ICD-10-CM | POA: Diagnosis not present

## 2018-10-03 DIAGNOSIS — Z91040 Latex allergy status: Secondary | ICD-10-CM | POA: Diagnosis not present

## 2018-10-03 MED ORDER — POVIDONE-IODINE 10 % EX SOLN
CUTANEOUS | Status: AC
  Filled 2018-10-03: qty 15

## 2018-10-03 MED ORDER — LIDOCAINE HCL (PF) 1 % IJ SOLN
INTRAMUSCULAR | Status: AC
  Filled 2018-10-03: qty 6

## 2018-10-03 NOTE — ED Provider Notes (Signed)
[name] Provider Note   CSN: [id number] Arrival date & time: 10/03/18  1654     History   Chief Complaint Chief Complaint  Patient presents with   Hemorrhoids    HPI [name] is a 33 y.o. female.     Patient complains of a painful hemorrhoid for a number days.  She is been using Anusol cream   Illness Location:  Rectum Quality:  Rectal pain Severity:  Severe Onset quality:  Sudden Timing:  Constant Progression:  Worsening Chronicity:  New Context:  Pain in recturm Relieved by:  Nothing Worsened by:  Having stool Ineffective treatments:  None Associated symptoms: no abdominal pain, no chest pain, no congestion, no cough, no diarrhea, no fatigue, no headaches and no rash     Past Medical History:  Diagnosis Date   Abnormal Pap smear    Hx of chlamydia infection    Hx of gonorrhea    Incompetent cervix in pregnancy    Left sided abdominal pain 09/15/2013   Migraines    No pertinent past medical history    Positive fecal occult blood test 09/15/2013   Rectal bleeding 09/15/2013   Vaginal Pap smear, abnormal     Patient Active Problem List   Diagnosis Date Noted   Adjustment disorder with depressed mood 08/01/2018   Left sided numbness 03/16/2018   Dysesthesia 03/16/2018   Chronic migraine 03/16/2018   Status post hysterectomy 06/26/2015   ASCUS with positive high risk HPV 06/06/2015   Dyspareunia, female 06/06/2015   Headache associated with sexual activity 03/20/2014   Pelvic pain in female 03/20/2014   Coital headache 02/24/2014   Pelvic pain affecting pregnancy 02/24/2014   Rectal bleeding 09/15/2013   Left sided abdominal pain 09/15/2013   Positive fecal occult blood test 09/15/2013   Hx of abnormal Pap smear 06/17/2012    Past Surgical History:  Procedure Laterality Date   ABDOMINAL HYSTERECTOMY N/A 06/26/2015   Procedure: HYSTERECTOMY ABDOMINAL;  Surgeon: Tilda BurrowJohn Ferguson V, MD;  Location: [facility];  Service: Gynecology;  Laterality: N/A;  procedure 1   BILATERAL SALPINGECTOMY Bilateral 06/26/2015   Procedure: BILATERAL SALPINGECTOMY;  Surgeon: Tilda BurrowJohn Ferguson V, MD;  Location: [facility];  Service: Gynecology;  Laterality: Bilateral;   BREAST SURGERY Left    benign cyst   CERVICAL BIOPSY     CERVICAL CERCLAGE N/A 2006   CESAREAN SECTION N/A 05/16/2012   Procedure: Primary cesarean section with delivery of baby girl at 1144. Apgars 8/9.;  Surgeon: Lesly DukesKelly H Leggett, MD;  Location: [facility];  Service: Obstetrics;  Laterality: N/A;   DILATION AND CURETTAGE OF UTERUS     ECTOPIC PREGNANCY SURGERY Left 2013   LAPAROSCOPY FOR ECTOPIC PREGNANCY  03/10/2011   Left Linear Salpingostomy,    REMOVAL OF NON VAGINAL CONTRACEPTIVE DEVICE N/A 06/26/2015   Procedure: Nexplanon Removal;  Surgeon: Tilda BurrowJohn Ferguson V, MD;  Location: [facility];  Service: Gynecology;  Laterality: N/A;   TONSILLECTOMY       OB History    Gravida  9   Para  3   Term      Preterm  3   AB  6   Living  3     SAB  5   TAB      Ectopic  1   Multiple      Live Births  3            Home Medications    Prior to Admission medications  Medication Sig Start Date End Date Taking? Authorizing Provider  diphenhydrAMINE (BENADRYL) 25 MG tablet Take 50 mg by mouth every 6 (six) hours as needed for allergies or sleep.    [provider]  metoCLOPramide (REGLAN) 10 MG tablet 10 mg once a week.  06/15/17   [provider]  valACYclovir (VALTREX) 1000 MG tablet Take 1 tablet (1,000 mg total) by mouth 2 (two) times daily. 09/13/18   Florian Buff, MD    Family History Family History  Problem Relation Age of Onset   Asthma Sister    Allergies Sister    Eczema Sister    Asthma Brother    Allergies Brother    Eczema Brother    Asthma Daughter    Allergies Daughter    Eczema Daughter    Thyroid disease Maternal Grandmother        had thyroid removed   Cancer Maternal Grandfather         lung   Cancer Paternal Grandmother        ovarian,stomach   Cancer Paternal Grandfather        prostate   Allergies Daughter    Eczema Daughter    Endometriosis Other    Diabetes Other    Hypertension Other    Cancer Other        colon and ovarian     Social History Social History   Tobacco Use   Smoking status: Never Smoker   Smokeless tobacco: Never Used  Substance Use Topics   Alcohol use: Yes    Comment: occasionally   Drug use: No     Allergies   Alprazolam and Latex   Review of Systems Review of Systems  Constitutional: Negative for appetite change and fatigue.  HENT: Negative for congestion, ear discharge and sinus pressure.   Eyes: Negative for discharge.  Respiratory: Negative for cough.   Cardiovascular: Negative for chest pain.  Gastrointestinal: Positive for rectal pain. Negative for abdominal pain and diarrhea.  Genitourinary: Negative for frequency and hematuria.  Musculoskeletal: Negative for back pain.  Skin: Negative for rash.  Neurological: Negative for seizures and headaches.  Psychiatric/Behavioral: Negative for hallucinations.     Physical Exam Updated Vital Signs BP (!) 158/88 (BP Location: Right Arm)    Pulse 73    Temp 98.4 F (36.9 C) (Oral)    Resp 18    Ht 5\' 7"  (1.702 m)    Wt 108.9 kg    LMP 08/01/2014    SpO2 100%    BMI 37.59 kg/m   Physical Exam Constitutional:      Appearance: She is well-developed.  HENT:     Head: Normocephalic.  Eyes:     General: No scleral icterus.    Conjunctiva/sclera: Conjunctivae normal.  Neck:     Musculoskeletal: Neck supple.     Thyroid: No thyromegaly.  Cardiovascular:     Rate and Rhythm: Normal rate and regular rhythm.     Heart sounds: No murmur. No friction rub. No gallop.   Pulmonary:     Breath sounds: No stridor. No wheezing or rales.  Chest:     Chest wall: No tenderness.  Abdominal:     General: There is no distension.     Tenderness: There is no abdominal  tenderness. There is no rebound.  Genitourinary:    Comments: Thrombosed hemorrhoid Musculoskeletal: Normal range of motion.  Lymphadenopathy:     Cervical: No cervical adenopathy.  Skin:    Findings: No erythema or  rash.  Neurological:     Mental Status: She is oriented to person, place, and time.     Motor: No abnormal muscle tone.     Coordination: Coordination normal.  Psychiatric:        Behavior: Behavior normal.      ED Treatments / Results  Labs (all labs ordered are listed, but only abnormal results are displayed) Labs Reviewed - No data to display  EKG None  Radiology No results found.  Procedures .Marland Kitchen.Incision and Drainage  Date/Time: 10/03/2018 7:11 PM Performed by: Bethann BerkshireZammit, Jazzalynn Rhudy, MD Authorized by: Bethann BerkshireZammit, Hattie Pine, MD   Comments:     Patient has a thrombosed hemorrhoid.  Area was cleaned thoroughly with Betadine.  2% lidocaine without epi was used to anesthetize the area.  #11 blade was used to open the hemorrhoid and a large clot was removed.  Patient tolerated the procedure well.  Area was cleaned with sterile gauze afterwards.   (including critical care time)  Medications Ordered in ED Medications  lidocaine (PF) (XYLOCAINE) 1 % injection (has no administration in time range)  povidone-iodine (BETADINE) 10 % external solution (has no administration in time range)     Initial Impression / Assessment and Plan / ED Course  I have reviewed the triage vital signs and the nursing notes.  Pertinent labs & imaging results that were available during my care of the patient were reviewed by me and considered in my medical decision making (see chart for details).   Patient with thrombosed hemorrhoid.  She will do sitz bath 4 times a day and follow-up with her doctor       Final Clinical Impressions(s) / ED Diagnoses   Final diagnoses:  External thrombosed hemorrhoids    ED Discharge Orders    None       Bethann BerkshireZammit, Dineen Conradt, MD 10/03/18 1912

## 2018-10-03 NOTE — Discharge Instructions (Addendum)
Sit in a bathtub 4 times a day for about 15 minutes and clean your bottom with soap and water.  Dry the area thoroughly and apply your cream.  Follow-up with your doctor next week

## 2018-10-03 NOTE — ED Triage Notes (Signed)
Patient c/o external hemorrhoid that she noticed two days ago with BM. Patient denies strianing. Per patient started to bleed and now has some blood clots with wiping. Per patient bright red blood and rectal discomfort. Patient reports using preparation H and soaking epison salt with only brief relief. Patient reports some dizziness.

## 2018-10-29 ENCOUNTER — Other Ambulatory Visit: Payer: BC Managed Care – PPO

## 2018-10-29 ENCOUNTER — Other Ambulatory Visit: Payer: Self-pay

## 2018-10-29 DIAGNOSIS — Z113 Encounter for screening for infections with a predominantly sexual mode of transmission: Secondary | ICD-10-CM | POA: Diagnosis not present

## 2018-11-03 LAB — NUSWAB VAGINITIS PLUS (VG+)
Candida albicans, NAA: POSITIVE — AB
Candida glabrata, NAA: NEGATIVE
Chlamydia trachomatis, NAA: NEGATIVE
Neisseria gonorrhoeae, NAA: NEGATIVE
Trich vag by NAA: NEGATIVE

## 2018-11-08 ENCOUNTER — Telehealth: Payer: Self-pay | Admitting: Obstetrics & Gynecology

## 2018-11-08 MED ORDER — FLUCONAZOLE 150 MG PO TABS: ORAL_TABLET | ORAL | 1 refills | Status: DC

## 2018-11-09 ENCOUNTER — Encounter: Payer: Self-pay | Admitting: *Deleted

## 2019-07-26 IMAGING — US ULTRASOUND RIGHT BREAST LIMITED
1 series · 13 of 23 positions shown · non-contrast
Comparison: June 2017

CLINICAL DATA: 33-year-old patient with palpable lump in the
upper-outer right breast. She has had some pain associated with the
lump. She says that the lump was large enough to change the contour
of her breast, but has since decreased.

EXAM:
DIGITAL DIAGNOSTIC BILATERAL MAMMOGRAM WITH CAD AND TOMO
ULTRASOUND RIGHT BREAST

[Series 1: ultrasound right breast limited · 0.07mm/px · 13 of 23 slices shown]
[im 1/23]
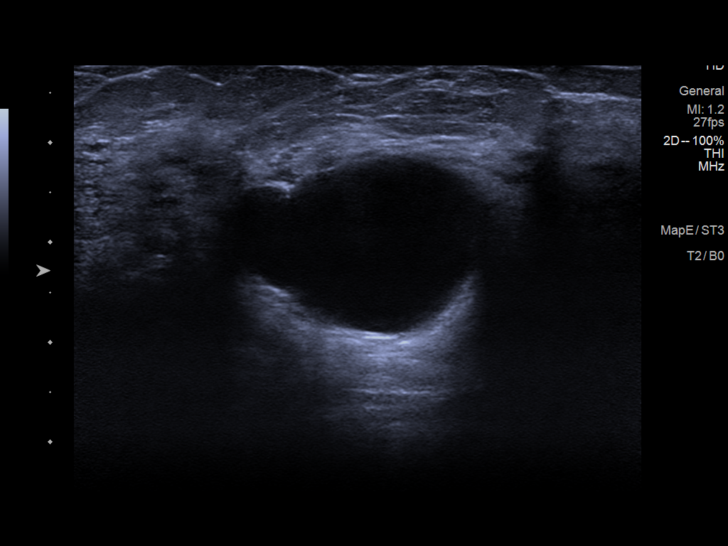
[im 3/23]
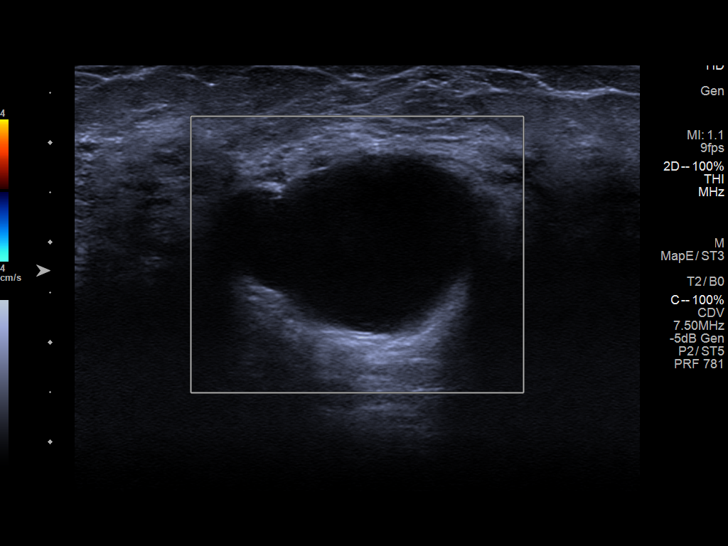
[im 5/23]
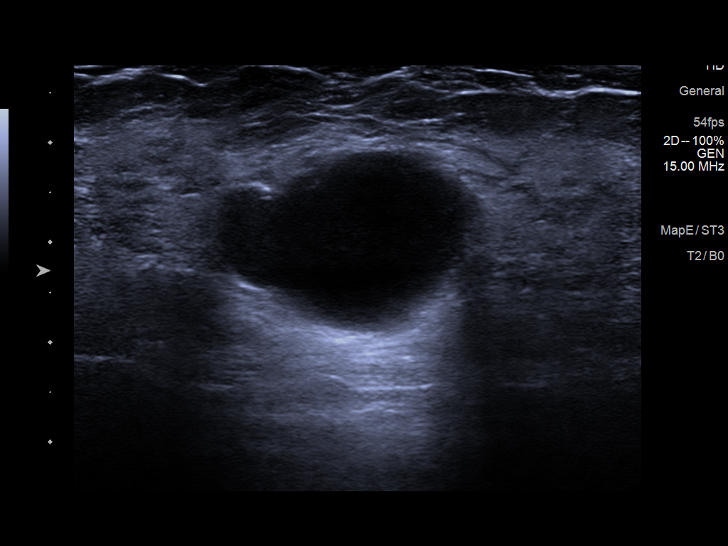
[im 7/23]
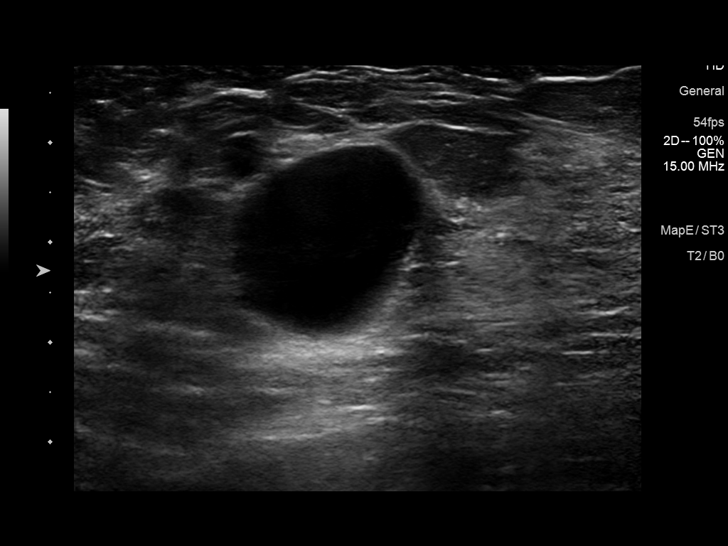
[im 8/23]
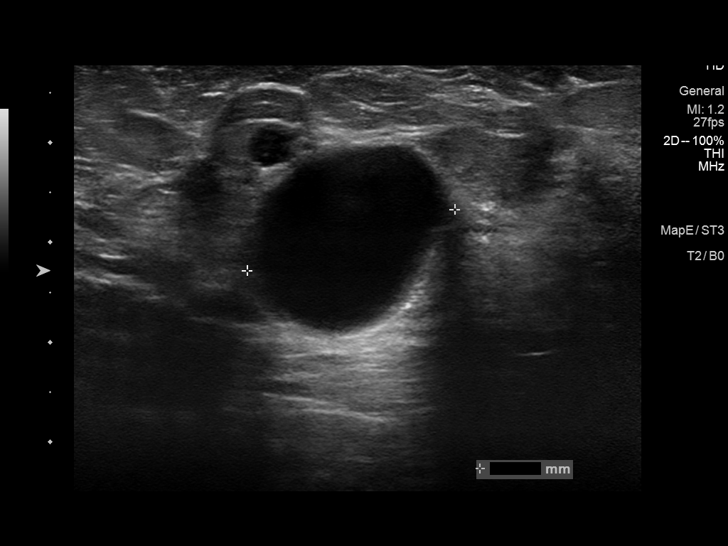
[im 10/23]
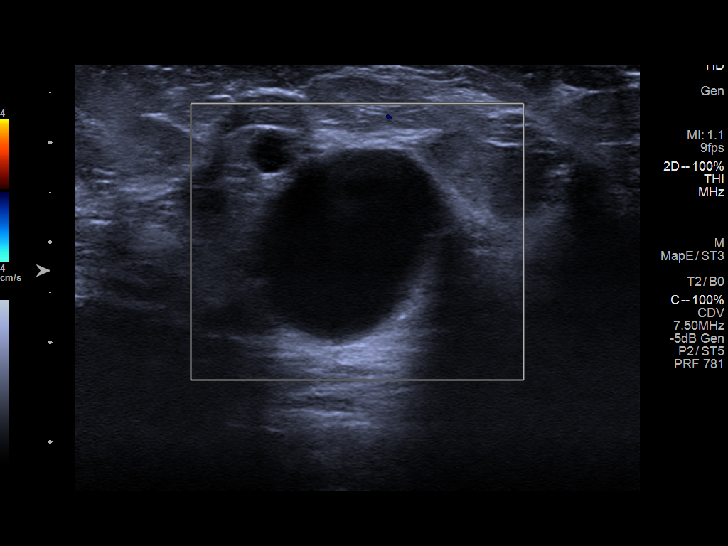
[im 12/23]
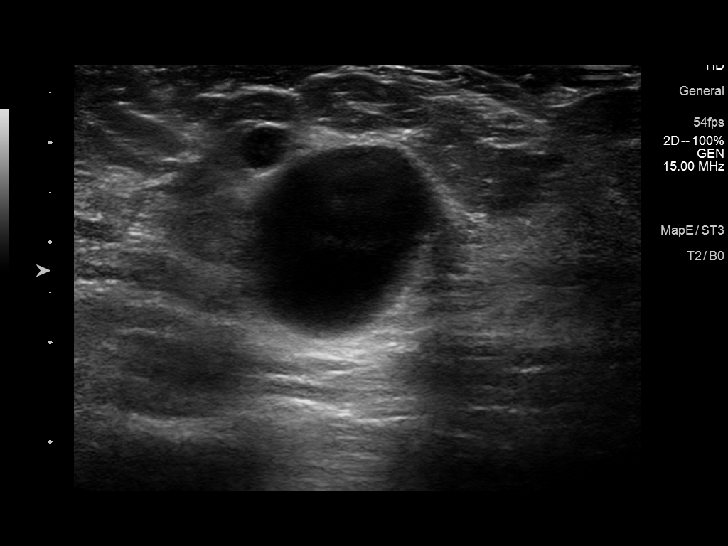
[im 14/23]
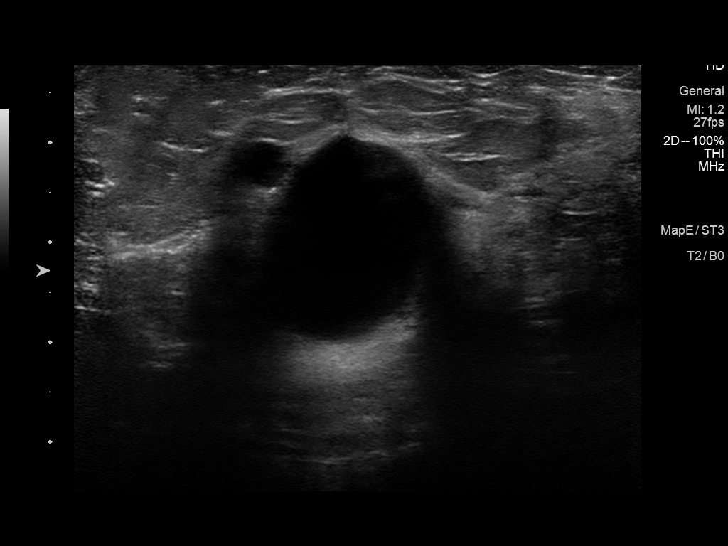
[im 16/23]
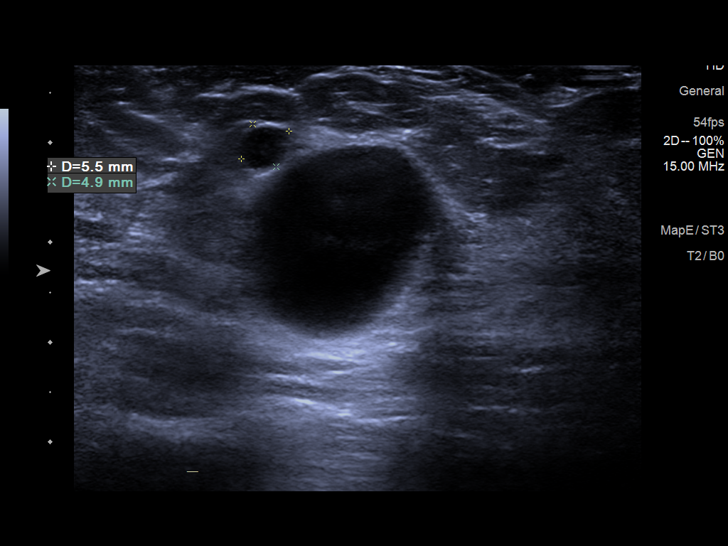
[im 17/23]
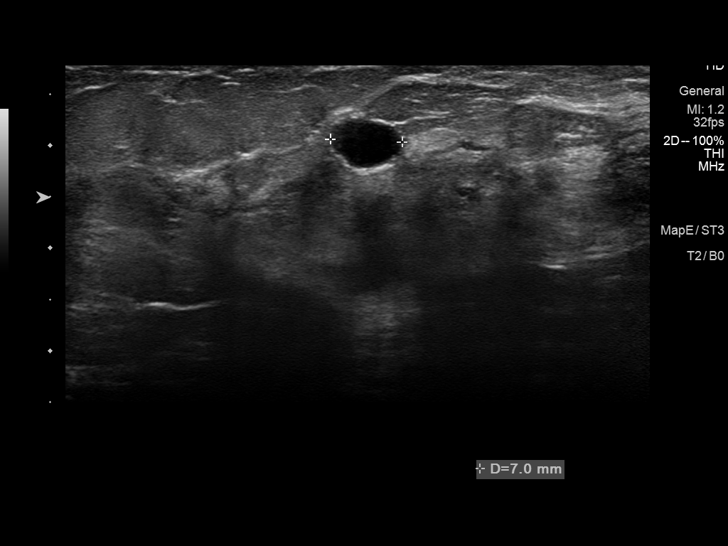
[im 19/23]
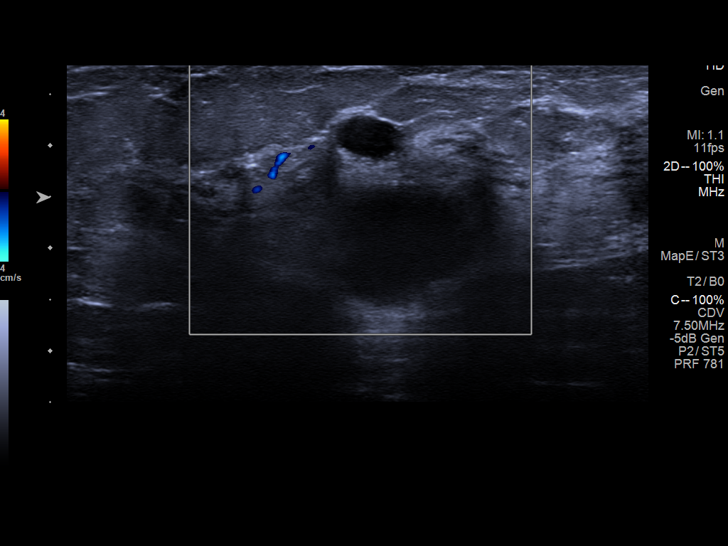
[im 21/23]
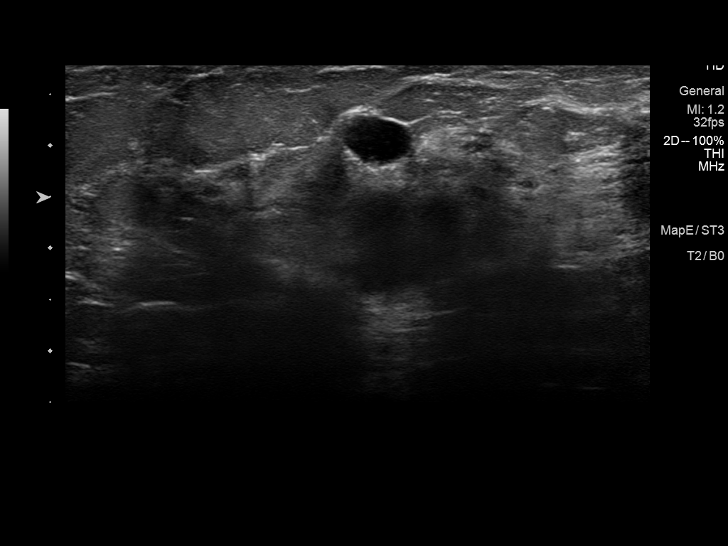
[im 23/23]
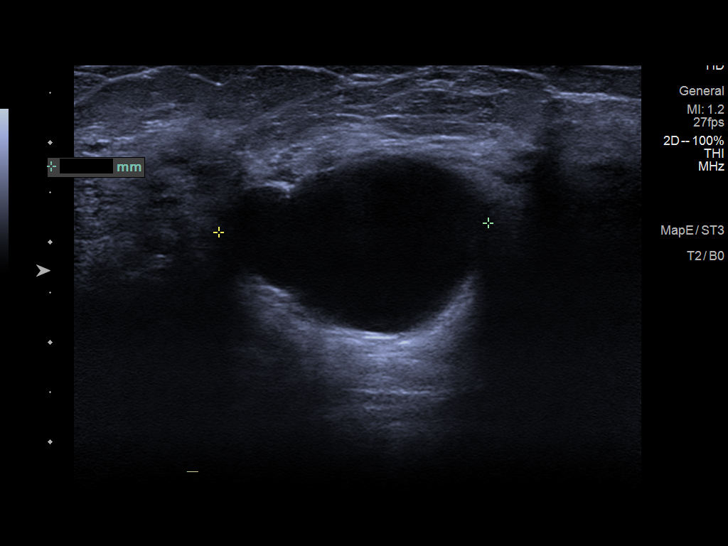

[13 of 23 positions shown; findings below may reference images not displayed]

ACR Breast Density Category c: The breast tissue is heterogeneously
dense, which may obscure small masses.
FINDINGS: Circumscribed oval mass is seen in the upper-outer right breast,
slightly more prominent in size compared to the mammogram from 7163.
There is a new circumscribed mass in the upper-outer quadrant of the
right breast immediately adjacent to the dominant mass.

No architectural distortion or suspicious microcalcification in the
right breast.

There are no findings to suggest malignancy in the left breast.

Mammographic images were processed with CAD.

On physical exam, I do palpate a circumscribed mobile 2 cm mass in
the 10 o'clock position of the right breast 4 cm from the nipple.

Targeted ultrasound is performed, showing an oval circumscribed cyst
with posterior acoustic enhancement in the 10 o'clock position of
the right breast 4 cm from the nipple. This cyst is definitely
larger in the anterior to posterior dimension compared to prior. The
cyst now measures 2.7 x 2.0 x 2.2 cm. In Saturday June, 2017 the cyst
measured 2.7 x 0.9 x 2.3 cm.

Adjacent to this dominant cyst is a more superficial simple cyst at
10 o'clock position 4 cm from nipple measuring 0.6 x 0.5 x 0.7 cm.
No internal vascular flow is identified within either cyst.
IMPRESSION: The palpable mass in the 10 o'clock position right breast
corresponds to a cyst. This cyst is larger compared to the mammogram
and ultrasound June 2017, currently measuring 2.7 x 2.0 x 2.2 cm.
There is an adjacent 0.7 cm simple cyst in the 10 o'clock position
of the right breast.

No evidence of malignancy in either breast.

RECOMMENDATION:
Screening mammogram at age 39 unless there are persistent or
intervening clinical concerns. (Code:B5-7-7I0)

I discussed with the patient that if this cyst continues to be
focally tender/painful, ultrasound-guided cyst aspiration is an
option for symptomatic relief.

I have discussed the findings and recommendations with the patient.
Results were also provided in writing at the conclusion of the
visit. If applicable, a reminder letter will be sent to the patient
regarding the next appointment.

BI-RADS CATEGORY  2: Benign.

## 2019-10-17 ENCOUNTER — Telehealth: Payer: Self-pay | Admitting: Obstetrics and Gynecology

## 2019-10-17 ENCOUNTER — Other Ambulatory Visit: Payer: Self-pay | Admitting: Obstetrics and Gynecology

## 2019-10-17 MED ORDER — FLUCONAZOLE 150 MG PO TABS: 150.0000 mg | ORAL_TABLET | ORAL | 1 refills | Status: DC

## 2019-10-17 NOTE — Progress Notes (Signed)
Diflucan sent to [facility]

## 2019-10-17 NOTE — Telephone Encounter (Signed)
Pt was seen in ER for pneumonia and started antibiotic, which turned into a yeast infection would like a separate antibiotic sent to pharmacy for yeast infection symptoms. Patient would like her medicine sent to [facility] [address]. Patient daughter tested positive for covid Friday and not able to come in for appointment due to in quarantine. patientdid test on sat for covid and was negative.

## 2019-10-19 ENCOUNTER — Other Ambulatory Visit: Payer: Self-pay | Admitting: Obstetrics and Gynecology

## 2020-09-19 ENCOUNTER — Ambulatory Visit (INDEPENDENT_AMBULATORY_CARE_PROVIDER_SITE_OTHER): Payer: BC Managed Care – PPO | Admitting: Advanced Practice Midwife

## 2020-09-19 ENCOUNTER — Other Ambulatory Visit (HOSPITAL_COMMUNITY)
Admission: RE | Admit: 2020-09-19 | Discharge: 2020-09-19 | Disposition: A | Discharge status: Home / Self Care | Payer: BC Managed Care – PPO | Source: Ambulatory Visit | Attending: Advanced Practice Midwife | Admitting: Advanced Practice Midwife

## 2020-09-19 ENCOUNTER — Other Ambulatory Visit: Payer: Self-pay

## 2020-09-19 ENCOUNTER — Encounter: Payer: Self-pay | Admitting: Advanced Practice Midwife

## 2020-09-19 VITALS — BP 110/74 | HR 68 | Ht 66.0 in | Wt 218.8 lb

## 2020-09-19 DIAGNOSIS — Z01419 Encounter for gynecological examination (general) (routine) without abnormal findings: Secondary | ICD-10-CM

## 2020-09-19 NOTE — Progress Notes (Signed)
WELL-WOMAN EXAMINATION Patient name: [name] MRN [id number]  Date of birth: 1986/01/06 Chief Complaint:   Gynecologic Exam (Pap Rosanne Gutting Maurice Small pap 05-21-15 ascus +hpv)  History of Present Illness:   [name] is a 35 y.o. [phone]  female being seen today for a routine well-woman exam.  Current complaints: vag odor- unable to describe; unsure if it is due to Covid smell-changes or an actual smell  PCP: doesn't have one currently      does not desire labs Patient's last menstrual period was 08/01/2014. The current method of family planning is  bilat salpingectomy and abd hysterectomy 2017 .  Last pap April 2017. 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 Results were: normal- simple cyst in R breast. Family h/o breast cancer: no Last colonoscopy: never. Family h/o colorectal cancer: no  Depression screen [facility] 2/9 09/19/2020  Decreased Interest 0  Down, Depressed, Hopeless 0  PHQ - 2 Score 0  Altered sleeping 0  Tired, decreased energy 0  Change in appetite 0  Feeling bad or failure about yourself  0  Trouble concentrating 0  Moving slowly or fidgety/restless 0  Suicidal thoughts 0  PHQ-9 Score 0     GAD 7 : Generalized Anxiety Score 09/19/2020  Nervous, Anxious, on Edge 0  Control/stop worrying 0  Worry too much - different things 1  Trouble relaxing 0  Restless 0  Easily annoyed or irritable 1  Afraid - awful might happen 0  Total GAD 7 Score 2     Review of Systems:   Pertinent items are noted in HPI Denies any headaches, blurred vision, fatigue, shortness of breath, chest pain, abdominal pain, abnormal vaginal discharge/itching/odor/irritation, problems with periods, bowel movements, urination, or intercourse unless otherwise stated above. Pertinent History Reviewed:  Reviewed past medical,surgical, social and family history.  Reviewed problem list, medications and  allergies. Physical Assessment:   Vitals:   09/19/20 0939  BP: 110/74  Pulse: 68  Weight: 218 lb 12.8 oz (99.2 kg)  Height: 5\' 6"  (1.676 m)  Body mass index is 35.32 kg/m.        Physical Examination:   General appearance - well appearing, and in no distress  Mental status - alert, oriented to person, place, and time  Psych:  She has a normal mood and affect  Skin - warm and dry, normal color, no suspicious lesions noted  Chest - effort normal, all lung fields clear to auscultation bilaterally  Heart - normal rate and regular rhythm  Neck:  midline trachea, no thyromegaly or nodules  Breasts - breasts appear normal, still with diffuse firmness on outer quad R side, no skin or nipple changes or  axillary nodes  Abdomen - soft, nontender, nondistended, no masses or organomegaly  Pelvic - VULVA: normal appearing vulva with no masses, tenderness or lesions  VAGINA: normal appearing vagina with normal color and discharge, no lesions; cuff pink without lesions  Thin prep pap is done on vag cuff with HR HPV cotesting  UTERUS: surgically absent  ADNEXA: No adnexal masses or tenderness noted.  Rectal - not done  Extremities:  No swelling or varicosities noted  Chaperone:    No results found for this or any previous visit (from the past 24 hour(s)).  Assessment & Plan:  1) Well-Woman Exam  2) Vag odor, will run tests for infection off vag Pap  Labs/procedures today: Pap  Mammogram: @ 36yo, or sooner if problems Colonoscopy: @ 36yo, or sooner if problems  No orders of the defined types were placed in this encounter.   Meds: No orders of the defined types were placed in this encounter.   Follow-up: Return in about 1 year (around 09/19/2021) for Physical.  Arabella Merles CNM 09/19/2020 11:13 AM

## 2020-09-23 LAB — CYTOLOGY - PAP
Chlamydia: NEGATIVE
Comment: NEGATIVE
Comment: NEGATIVE
Comment: NORMAL
Diagnosis: NEGATIVE
High risk HPV: NEGATIVE
Neisseria Gonorrhea: NEGATIVE

## 2021-11-11 ENCOUNTER — Encounter: Payer: Self-pay | Admitting: Obstetrics & Gynecology

## 2021-11-11 ENCOUNTER — Other Ambulatory Visit (HOSPITAL_COMMUNITY)
Admission: RE | Admit: 2021-11-11 | Discharge: 2021-11-11 | Disposition: A | Discharge status: Home / Self Care | Payer: BC Managed Care – PPO | Source: Ambulatory Visit | Attending: Obstetrics & Gynecology | Admitting: Obstetrics & Gynecology

## 2021-11-11 ENCOUNTER — Ambulatory Visit (INDEPENDENT_AMBULATORY_CARE_PROVIDER_SITE_OTHER): Payer: BC Managed Care – PPO | Admitting: Obstetrics & Gynecology

## 2021-11-11 VITALS — BP 127/79 | HR 67 | Ht 66.0 in | Wt 221.0 lb

## 2021-11-11 DIAGNOSIS — R739 Hyperglycemia, unspecified: Secondary | ICD-10-CM | POA: Diagnosis not present

## 2021-11-11 DIAGNOSIS — Z01419 Encounter for gynecological examination (general) (routine) without abnormal findings: Secondary | ICD-10-CM

## 2021-11-11 DIAGNOSIS — Z113 Encounter for screening for infections with a predominantly sexual mode of transmission: Secondary | ICD-10-CM | POA: Insufficient documentation

## 2021-11-11 MED ORDER — FLUCONAZOLE 150 MG PO TABS: ORAL_TABLET | ORAL | 1 refills | Status: DC

## 2021-11-11 NOTE — Progress Notes (Signed)
Subjective:     [name] is a 36 y.o. female here for a routine exam.  Patient's last menstrual period was 08/01/2014. [id number] Birth Control Method:  hysterectomy Menstrual Calendar(currently): amenorrhea  Current complaints: some tenderness of right breast fibrocystic.   Current acute medical issues:     Recent Gynecologic History Patient's last menstrual period was 08/01/2014. Last Pap: n/a,   Last mammogram: at age 11,    Past Medical History:  Diagnosis Date   Abnormal Pap smear    Hx of chlamydia infection    Hx of gonorrhea    Incompetent cervix in pregnancy    Left sided abdominal pain 09/15/2013   Migraines    No pertinent past medical history    Positive fecal occult blood test 09/15/2013   Rectal bleeding 09/15/2013   Vaginal Pap smear, abnormal     Past Surgical History:  Procedure Laterality Date   ABDOMINAL HYSTERECTOMY N/A 06/26/2015   Procedure: HYSTERECTOMY ABDOMINAL;  Surgeon: Tilda Burrow, MD;  Location: [facility];  Service: Gynecology;  Laterality: N/A;  procedure 1   BILATERAL SALPINGECTOMY Bilateral 06/26/2015   Procedure: BILATERAL SALPINGECTOMY;  Surgeon: Tilda Burrow, MD;  Location: [facility];  Service: Gynecology;  Laterality: Bilateral;   BREAST SURGERY Left    benign cyst   CERVICAL BIOPSY     CERVICAL CERCLAGE N/A 2006   CESAREAN SECTION N/A 05/16/2012   Procedure: Primary cesarean section with delivery of baby girl at 1144. Apgars 8/9.;  Surgeon: Lesly Dukes, MD;  Location: [facility];  Service: Obstetrics;  Laterality: N/A;   DILATION AND CURETTAGE OF UTERUS     ECTOPIC PREGNANCY SURGERY Left 2013   LAPAROSCOPY FOR ECTOPIC PREGNANCY  03/10/2011   Left Linear Salpingostomy,    REMOVAL OF NON VAGINAL CONTRACEPTIVE DEVICE N/A 06/26/2015   Procedure: Nexplanon Removal;  Surgeon: Tilda Burrow, MD;  Location: [facility];  Service: Gynecology;  Laterality: N/A;   TONSILLECTOMY      OB History     Gravida  9   Para  3   Term      Preterm  3    AB  6   Living  3      SAB  5   IAB      Ectopic  1   Multiple      Live Births  3           Social History   Socioeconomic History   Marital status: Married    Spouse name: Barry Dienes   Number of children: 3   Years of education: 14   Highest education level: Not on file  Occupational History   Occupation: [employer]  Tobacco Use   Smoking status: Never   Smokeless tobacco: Never  Vaping Use   Vaping Use: Never used  Substance and Sexual Activity   Alcohol use: Yes    Comment: occasionally   Drug use: No   Sexual activity: Yes    Birth control/protection: Surgical    Comment: hyst  Other Topics Concern   Not on file  Social History Narrative   Lives w/ husband and children   Caffeine use: About 5-6 pepsi's per day   Right handed    Social Determinants of Health   Financial Resource Strain: Medium Risk (11/11/2021)   Overall Financial Resource Strain (CARDIA)    Difficulty of Paying Living Expenses: Somewhat hard  Food Insecurity: Food Insecurity Present (11/11/2021)   Hunger Vital Sign  Worried About Programme researcher, broadcasting/film/video in the Last Year: Sometimes true    Ran Out of Food in the Last Year: Never true  Transportation Needs: Unmet Transportation Needs (11/11/2021)   PRAPARE - Administrator, Civil Service (Medical): Yes    Lack of Transportation (Non-Medical): Yes  Physical Activity: Sufficiently Active (11/11/2021)   Exercise Vital Sign    Days of Exercise per Week: 7 days    Minutes of Exercise per Session: 60 min  Stress: Stress Concern Present (11/11/2021)   [employer] of Occupational Health - Occupational Stress Questionnaire    Feeling of Stress : Very much  Social Connections: Socially Isolated (11/11/2021)   Social Connection and Isolation Panel [NHANES]    Frequency of Communication with Friends and Family: Once a week    Frequency of Social Gatherings with Friends and Family: Once a week    Attends  Religious Services: 1 to 4 times per year    Active Member of [employer] or Organizations: No    Attends Banker Meetings: Never    Marital Status: Separated    Family History  Problem Relation Age of Onset   Asthma Sister    Allergies Sister    Eczema Sister    Asthma Brother    Allergies Brother    Eczema Brother    Asthma Daughter    Allergies Daughter    Eczema Daughter    Thyroid disease Maternal Grandmother        had thyroid removed   Cancer Maternal Grandfather        lung   Cancer Paternal Grandmother        ovarian,stomach   Cancer Paternal Grandfather        prostate   Allergies Daughter    Eczema Daughter    Endometriosis Other    Diabetes Other    Hypertension Other    Cancer Other        colon and ovarian      Current Outpatient Medications:    acetaminophen (TYLENOL) 325 MG tablet, Take 650 mg by mouth every 6 (six) hours as needed., Disp: , Rfl:    fluconazole (DIFLUCAN) 150 MG tablet, Take 1 tablet today and repeat in 3 days, Disp: 2 tablet, Rfl: 1   valACYclovir (VALTREX) 1000 MG tablet, Take 1 tablet (1,000 mg total) by mouth 2 (two) times daily., Disp: 10 tablet, Rfl: 11  Review of Systems  Review of Systems  Constitutional: Negative for fever, chills, weight loss, malaise/fatigue and diaphoresis.  HENT: Negative for hearing loss, ear pain, nosebleeds, congestion, sore throat, neck pain, tinnitus and ear discharge.   Eyes: Negative for blurred vision, double vision, photophobia, pain, discharge and redness.  Respiratory: Negative for cough, hemoptysis, sputum production, shortness of breath, wheezing and stridor.   Cardiovascular: Negative for chest pain, palpitations, orthopnea, claudication, leg swelling and PND.  Gastrointestinal: negative for abdominal pain. Negative for heartburn, nausea, vomiting, diarrhea, constipation, blood in stool and melena.  Genitourinary: Negative for dysuria, urgency, frequency, hematuria and flank pain.   Musculoskeletal: Negative for myalgias, back pain, joint pain and falls.  Skin: Negative for itching and rash.  Neurological: Negative for dizziness, tingling, tremors, sensory change, speech change, focal weakness, seizures, loss of consciousness, weakness and headaches.  Endo/Heme/Allergies: Negative for environmental allergies and polydipsia. Does not bruise/bleed easily.  Psychiatric/Behavioral: Negative for depression, suicidal ideas, hallucinations, memory loss and substance abuse. The patient is not nervous/anxious and does not have insomnia.  Objective:  Blood pressure 127/79, pulse 67, height 5\' 6"  (1.676 m), weight 221 lb (100.2 kg), last menstrual period 08/01/2014.   Physical Exam  Vitals reviewed. Constitutional: She is oriented to person, place, and time. She appears well-developed and well-nourished.  HENT:  Head: Normocephalic and atraumatic.        Right Ear: External ear normal.  Left Ear: External ear normal.  Nose: Nose normal.  Mouth/Throat: Oropharynx is clear and moist.  Eyes: Conjunctivae and EOM are normal. Pupils are equal, round, and reactive to light. Right eye exhibits no discharge. Left eye exhibits no discharge. No scleral icterus.  Neck: Normal range of motion. Neck supple. No tracheal deviation present. No thyromegaly present.  Cardiovascular: Normal rate, regular rhythm, normal heart sounds and intact distal pulses.  Exam reveals no gallop and no friction rub.   No murmur heard. Respiratory: Effort normal and breath sounds normal. No respiratory distress. She has no wheezes. She has no rales. She exhibits no tenderness.  GI: Soft. Bowel sounds are normal. She exhibits no distension and no mass. There is no tenderness. There is no rebound and no guarding.  Genitourinary:  Breasts stable oblong fibrocystic change in the Upper outer quadrant of the breast corresponding to the area noted 7/20, no change in size, recommendation is for screening mammogram  age 68, otherwise, no masses skin changes or nipple changes bilaterally      Vulva is normal without lesions Vagina is pink moist with white discharge consistent with yeast Cervix absent Uterus is absent  Adnexa is negative with normal sized ovaries   Musculoskeletal: Normal range of motion. She exhibits no edema and no tenderness.  Neurological: She is alert and oriented to person, place, and time. She has normal reflexes. She displays normal reflexes. No cranial nerve deficit. She exhibits normal muscle tone. Coordination normal.  Skin: Skin is warm and dry. No rash noted. No erythema. No pallor.  Psychiatric: She has a normal mood and affect. Her behavior is normal. Judgment and thought content normal.       Medications Ordered at today's visit: Meds ordered this encounter  Medications   fluconazole (DIFLUCAN) 150 MG tablet    Sig: Take 1 tablet today and repeat in 3 days    Dispense:  2 tablet    Refill:  1    Other orders placed at today's visit: Orders Placed This Encounter  Procedures   HgB A1c      Assessment:    Normal Gyn exam.   Yeast vaginitis-->check her A1C since she drinks 7-8 pepsi/day Plan:    Contraception: status post hysterectomy. Follow up in: 3 years.     No follow-ups on file.

## 2021-11-12 LAB — HEMOGLOBIN A1C
Est. average glucose Bld gHb Est-mCnc: 120 mg/dL
Hgb A1c MFr Bld: 5.8 % — ABNORMAL HIGH (ref 4.8–5.6)

## 2021-11-12 LAB — CERVICOVAGINAL ANCILLARY ONLY
Bacterial Vaginitis (gardnerella): POSITIVE — AB
Candida Glabrata: NEGATIVE
Candida Vaginitis: POSITIVE — AB
Chlamydia: NEGATIVE
Comment: NEGATIVE
Comment: NEGATIVE
Comment: NEGATIVE
Comment: NEGATIVE
Comment: NEGATIVE
Comment: NORMAL
Neisseria Gonorrhea: NEGATIVE
Trichomonas: NEGATIVE

## 2022-02-14 ENCOUNTER — Other Ambulatory Visit: Payer: BC Managed Care – PPO

## 2022-02-25 ENCOUNTER — Ambulatory Visit: Payer: BC Managed Care – PPO | Admitting: Adult Health

## 2022-09-18 ENCOUNTER — Other Ambulatory Visit: Payer: Self-pay

## 2022-09-18 ENCOUNTER — Emergency Department (HOSPITAL_COMMUNITY): Payer: Medicaid Other

## 2022-09-18 ENCOUNTER — Encounter (HOSPITAL_COMMUNITY): Payer: Self-pay | Admitting: Emergency Medicine

## 2022-09-18 ENCOUNTER — Emergency Department (HOSPITAL_COMMUNITY)
Admission: EM | Admit: 2022-09-18 | Discharge: 2022-09-19 | Disposition: A | Discharge status: Home / Self Care | Payer: Medicaid Other | Attending: Emergency Medicine | Admitting: Emergency Medicine

## 2022-09-18 DIAGNOSIS — R531 Weakness: Secondary | ICD-10-CM | POA: Insufficient documentation

## 2022-09-18 DIAGNOSIS — Z91040 Latex allergy status: Secondary | ICD-10-CM | POA: Diagnosis not present

## 2022-09-18 LAB — URINALYSIS, ROUTINE W REFLEX MICROSCOPIC
Bilirubin Urine: NEGATIVE
Glucose, UA: NEGATIVE mg/dL
Hgb urine dipstick: NEGATIVE
Ketones, ur: NEGATIVE mg/dL
Leukocytes,Ua: NEGATIVE
Nitrite: NEGATIVE
Protein, ur: 30 mg/dL — AB
Specific Gravity, Urine: 1.036 — ABNORMAL HIGH (ref 1.005–1.030)
pH: 5 (ref 5.0–8.0)

## 2022-09-18 LAB — BASIC METABOLIC PANEL
Anion gap: 7 (ref 5–15)
BUN: 13 mg/dL (ref 6–20)
CO2: 25 mmol/L (ref 22–32)
Calcium: 8.8 mg/dL — ABNORMAL LOW (ref 8.9–10.3)
Chloride: 102 mmol/L (ref 98–111)
Creatinine, Ser: 0.86 mg/dL (ref 0.44–1.00)
GFR, Estimated: >60 mL/min (ref >60)
Glucose, Bld: 110 mg/dL — ABNORMAL HIGH (ref 70–99)
Potassium: 4.1 mmol/L (ref 3.5–5.1)
Sodium: 134 mmol/L — ABNORMAL LOW (ref 135–145)

## 2022-09-18 LAB — CBC
HCT: 40.3 % (ref 36.0–46.0)
Hemoglobin: 13.1 g/dL (ref 12.0–15.0)
MCH: 30.4 pg (ref 26.0–34.0)
MCHC: 32.5 g/dL (ref 30.0–36.0)
MCV: 93.5 fL (ref 80.0–100.0)
Platelets: 333 10*3/uL (ref 150–400)
RBC: 4.31 MIL/uL (ref 3.87–5.11)
RDW: 12.7 % (ref 11.5–15.5)
WBC: 8.4 10*3/uL (ref 4.0–10.5)
nRBC: 0 % (ref 0.0–0.2)

## 2022-09-18 LAB — TROPONIN I (HIGH SENSITIVITY)
Troponin I (High Sensitivity): 16 ng/L (ref <18)
Troponin I (High Sensitivity): 10 ng/L (ref <18)

## 2022-09-18 LAB — CBG MONITORING, ED: Glucose-Capillary: 94 mg/dL (ref 70–99)

## 2022-09-18 MED ORDER — ACETAMINOPHEN 325 MG PO TABS
650.0000 mg | ORAL_TABLET | Freq: Once | ORAL | Status: AC
  Administered 2022-09-18: 650 mg via ORAL
  Filled 2022-09-18: qty 2

## 2022-09-18 MED ORDER — SODIUM CHLORIDE 0.9 % IV BOLUS
1000.0000 mL | Freq: Once | INTRAVENOUS | Status: AC
  Administered 2022-09-18: 1000 mL via INTRAVENOUS

## 2022-09-18 NOTE — Discharge Instructions (Addendum)
Please follow-up with primary care provider for recent symptoms and ER visit.  You may take Tylenol every 6 hours needed for pain.  Today your labs and imaging was reassuring however you will need to monitor your symptoms and symptoms change or worsen please return to ER.

## 2022-09-18 NOTE — ED Provider Triage Note (Signed)
Emergency Medicine Provider Triage Evaluation Note  [name] , a 36 y.o. female  was evaluated in triage.  Pt complains of lightheadeness, weakness,  diaphoresis and feeling of dehydration after walking outdoors in the heat today for approx 2 hours.  She had water with her, but not enough.  She has also developed chest tightness since arrival here.  Review of Systems  Positive: Weakness, lightheadedness, diaphoresis, chest pressure Negative: Vomiting lung mass abdominal pain, headache  Physical Exam  BP 135/80   Pulse 90   Temp 98.4 F (36.9 C) (Oral)   Ht 5\' 6"  (1.676 m)   Wt 87 kg   LMP 08/01/2014   SpO2 100%   BMI 30.96 kg/m  Gen:   Awake, no distress   Resp:  Normal effort  MSK:   Moves extremities without difficulty  Other:    Medical Decision Making  Medically screening exam initiated at 3:47 PM.  Appropriate orders placed.  [name] was informed that the remainder of the evaluation will be completed by another provider, this initial triage assessment does not replace that evaluation, and the importance of remaining in the ED until their evaluation is complete.     Burgess Amor, PA-C 09/18/22 1551

## 2022-09-18 NOTE — ED Provider Notes (Signed)
[place] EMERGENCY DEPARTMENT AT [facility] Provider Note   CSN: [id number] Arrival date & time: 09/18/22  1504     History  Chief Complaint  Patient presents with   Dehydration    [name] is a 37 y.o. female presented with concerns for dehydration.  Patient that she was out in the sun all day has been sweating profusely.  Patient states that she feels that her mouth is dry and that while she was on the side she had intermittent episodes of dull chest pain that lasted for few seconds.  Patient denies any cardiac history or shortness of breath with this.  Patient states that she feels overall weak but denies any vision changes, neck pain, dysuria, abdominal pain, nausea/vomiting.  Patient denies any recent illnesses or fevers.    Home Medications Prior to Admission medications   Medication Sig Start Date End Date Taking? Authorizing Provider  acetaminophen (TYLENOL) 325 MG tablet Take 650 mg by mouth every 6 (six) hours as needed. 10/13/19   [provider]  fluconazole (DIFLUCAN) 150 MG tablet Take 1 tablet today and repeat in 3 days 11/11/21   Lazaro Arms, MD  valACYclovir (VALTREX) 1000 MG tablet Take 1 tablet (1,000 mg total) by mouth 2 (two) times daily. 09/13/18   Lazaro Arms, MD      Allergies    Alprazolam and Latex    Review of Systems   Review of Systems See HPI Physical Exam Updated Vital Signs BP 135/80   Pulse 60   Temp 98.4 F (36.9 C) (Oral)   Resp 15   Ht 5\' 6"  (1.676 m)   Wt 87 kg   LMP 08/01/2014   SpO2 98%   BMI 30.96 kg/m  Physical Exam Vitals reviewed.  Constitutional:      General: She is not in acute distress. HENT:     Head: Normocephalic and atraumatic.  Eyes:     Extraocular Movements: Extraocular movements intact.     Conjunctiva/sclera: Conjunctivae normal.     Pupils: Pupils are equal, round, and reactive to light.  Cardiovascular:     Rate and Rhythm: Normal rate and regular rhythm.     Pulses:  Normal pulses.     Heart sounds: Normal heart sounds.     Comments: 2+ bilateral radial/dorsalis pedis pulses with regular rate Pulmonary:     Effort: Pulmonary effort is normal. No respiratory distress.     Breath sounds: Normal breath sounds.  Abdominal:     Palpations: Abdomen is soft.     Tenderness: There is no abdominal tenderness. There is no guarding or rebound.  Musculoskeletal:        General: Normal range of motion.     Cervical back: Normal range of motion and neck supple.     Comments: 5 out of 5 bilateral grip/leg extension strength  Skin:    General: Skin is warm and dry.     Capillary Refill: Capillary refill takes less than 2 seconds.  Neurological:     General: No focal deficit present.     Mental Status: She is alert and oriented to person, place, and time.     Comments: Sensation intact in all 4 limbs  Psychiatric:        Mood and Affect: Mood normal.     ED Results / Procedures / Treatments   Labs (all labs ordered are listed, but only abnormal results are displayed) Labs Reviewed  BASIC METABOLIC PANEL - Abnormal;  Notable for the following components:      Result Value   Sodium 134 (*)    Glucose, Bld 110 (*)    Calcium 8.8 (*)    All other components within normal limits  URINALYSIS, ROUTINE W REFLEX MICROSCOPIC - Abnormal; Notable for the following components:   APPearance HAZY (*)    Specific Gravity, Urine 1.036 (*)    Protein, ur 30 (*)    Bacteria, UA RARE (*)    All other components within normal limits  CBC  CBG MONITORING, ED  TROPONIN I (HIGH SENSITIVITY)  TROPONIN I (HIGH SENSITIVITY)    EKG None  Radiology DG Chest Port 1 View  Result Date: 09/18/2022 CLINICAL DATA:  Chest pain, dehydration. EXAM: PORTABLE CHEST 1 VIEW COMPARISON:  A 06/06/2016. FINDINGS: The heart size and mediastinal contours are within normal limits. Both lungs are clear. No acute osseous abnormality. IMPRESSION: No active disease. Electronically Signed   By:  Thornell Sartorius M.D.   On: 09/18/2022 20:27    Procedures Procedures    Medications Ordered in ED Medications  acetaminophen (TYLENOL) tablet 650 mg (has no administration in time range)  sodium chloride 0.9 % bolus 1,000 mL (0 mLs Intravenous Stopped 09/18/22 2320)    ED Course/ Medical Decision Making/ A&P                                 Medical Decision Making Amount and/or Complexity of Data Reviewed Radiology: ordered.   [name] 37 y.o. presented today for dehydration. Working DDx that I considered at this time includes, but not limited to, dehydration, AKI, electrolyte abnormalities, ACS, MSK.  R/o DDx: dehydration, AKI, electrolyte abnormalities, ACS, MSK: These are considered less likely due to history of present illness and physical exam findings  Review of prior external notes: 11/11/2021 office visit  Unique Tests and My Interpretation:  CBC: Unremarkable BMP: Unremarkable UA: Unremarkable Troponin: 16,10 CBG: 94 EKG: Sinus 61 bpm, no ST elevations or depressions noted, no blocks noted Chest x-ray: Unremarkable  Discussion with Independent Historian: None  Discussion of Management of Tests: None  Risk: Low: based on diagnostic testing/clinical impression and treatment plan  Risk Stratification Score: None  Plan: On exam patient was in no acute distress stable vitals.  Patient's physical exam was unremarkable however due to patient endorsing chest pain earlier this afternoon chest pain labs will be ordered along with fluids as patient is concerned for dehydration.  Patient was given fluids and monitored.  Patient's labs came back reassuring.  Patient states after receiving fluids she felt much better.  Pending delta troponin patient may be discharged with outpatient follow-up.  Patient's delta troponin was negative.  At this time patient is stable to be discharged with outpatient follow-up.  Encouraged patient use Tylenol every 6 hours as needed for pain  and to return to ER if symptoms are to change or worsen.  Patient was given return precautions. Patient stable for discharge at this time.  Patient verbalized understanding of plan.         Final Clinical Impression(s) / ED Diagnoses Final diagnoses:  Weakness    Rx / DC Orders ED Discharge Orders     None         Remi Deter 09/18/22 2338    Rondel Baton, MD 09/20/22 [phone]

## 2022-09-18 NOTE — ED Triage Notes (Signed)
Pt reports she is concerned for dehydration, reports she was in the sun and sweating for 4 hours, states urine is dark, sweat is "salty", weakness

## 2022-11-03 ENCOUNTER — Encounter: Payer: Self-pay | Admitting: Obstetrics & Gynecology

## 2022-11-03 MED ORDER — FLUCONAZOLE 150 MG PO TABS: ORAL_TABLET | ORAL | 1 refills | Status: DC

## 2022-11-13 ENCOUNTER — Ambulatory Visit: Payer: Medicaid Other | Admitting: Obstetrics & Gynecology

## 2023-02-05 ENCOUNTER — Encounter: Payer: Self-pay | Admitting: Adult Health

## 2023-02-05 ENCOUNTER — Ambulatory Visit: Payer: Medicaid Other | Admitting: Adult Health

## 2023-02-05 VITALS — BP 119/80 | HR 69 | Ht 67.0 in | Wt 197.0 lb

## 2023-02-05 DIAGNOSIS — N6321 Unspecified lump in the left breast, upper outer quadrant: Secondary | ICD-10-CM | POA: Insufficient documentation

## 2023-02-05 NOTE — Progress Notes (Signed)
  Subjective:     Patient ID: [name], female   DOB: 12-27-1985, 37 y.o.   MRN: [id number]  HPI [name] is a 37 year old female,single, sp hysterectomy in complaining of left breast mass, noticed 01/31/23, it is tender. She is working at [employer] now.     Component Value Date/Time   DIAGPAP  09/19/2020 1001    - Negative for intraepithelial lesion or malignancy (NILM)   HPVHIGH Negative 09/19/2020 1001   ADEQPAP Satisfactory for evaluation. 09/19/2020 1001   PCP is Dr Letitia Libra   Review of Systems +left breast mass Reviewed past medical,surgical, social and family history. Reviewed medications and allergies.     Objective:   Physical Exam BP 119/80 (BP Location: Left Arm, Patient Position: Sitting, Cuff Size: Normal)   Pulse 69   Ht 5\' 7"  (1.702 m)   Wt 197 lb (89.4 kg)   LMP 08/01/2014   BMI 30.85 kg/m      Skin warm and dry,  Breasts:no dominate palpable mass, retraction or nipple discharge on the right, on the left, no retraction or nipple discharge, has firm tender mass about 2 cm at 2 0'clock 3 FB from areola. Fall risk is low  Upstream - 02/05/23 0906       Pregnancy Intention Screening   Does the patient want to become pregnant in the next year? N/A    Does the patient's partner want to become pregnant in the next year? N/A    Would the patient like to discuss contraceptive options today? N/A      Contraception Wrap Up   Current Method Female Sterilization   hysterectomy   End Method Female Sterilization    Contraception Counseling Provided No             Assessment:     1. Mass of upper outer quadrant of left breast (Primary) +has firm tender mass about 2 cm at 2 0'clock 3 FB from areola. Noticed 01/31/23 Diagnostic mammogram and [place] scheduled at [facility] 03/31/23 at 2:40 pm, they will call if sooner appt opens up Decrease caffeine Do not wear under wire Do not keep feeling it,can make more sore - [place] LIMITED ULTRASOUND INCLUDING AXILLA RIGHT BREAST;  Future - MM 3D DIAGNOSTIC MAMMOGRAM BILATERAL BREAST; Future - [place] LIMITED ULTRASOUND INCLUDING AXILLA LEFT BREAST ; Future     Plan:     Return in 1 month for physical and labs

## 2023-03-13 ENCOUNTER — Ambulatory Visit: Payer: Medicaid Other | Admitting: Adult Health

## 2023-03-31 ENCOUNTER — Encounter (HOSPITAL_COMMUNITY): Payer: Self-pay

## 2023-03-31 ENCOUNTER — Ambulatory Visit (HOSPITAL_COMMUNITY)
Admission: RE | Admit: 2023-03-31 | Discharge: 2023-03-31 | Disposition: A | Discharge status: Home / Self Care | Payer: Medicaid Other | Source: Ambulatory Visit | Attending: Adult Health | Admitting: Adult Health

## 2023-03-31 ENCOUNTER — Other Ambulatory Visit (HOSPITAL_COMMUNITY): Payer: Self-pay | Admitting: Adult Health

## 2023-03-31 DIAGNOSIS — N6321 Unspecified lump in the left breast, upper outer quadrant: Secondary | ICD-10-CM | POA: Insufficient documentation

## 2023-03-31 DIAGNOSIS — R928 Other abnormal and inconclusive findings on diagnostic imaging of breast: Secondary | ICD-10-CM

## 2023-04-02 ENCOUNTER — Ambulatory Visit (HOSPITAL_COMMUNITY)
Admission: RE | Admit: 2023-04-02 | Discharge: 2023-04-02 | Disposition: A | Discharge status: Home / Self Care | Payer: Medicaid Other | Source: Ambulatory Visit | Attending: Adult Health | Admitting: Adult Health

## 2023-04-02 ENCOUNTER — Encounter (HOSPITAL_COMMUNITY): Payer: Self-pay

## 2023-04-02 DIAGNOSIS — R928 Other abnormal and inconclusive findings on diagnostic imaging of breast: Secondary | ICD-10-CM

## 2023-04-02 DIAGNOSIS — N6002 Solitary cyst of left breast: Secondary | ICD-10-CM | POA: Diagnosis present

## 2023-04-02 MED ORDER — LIDOCAINE HCL (PF) 2 % IJ SOLN
10.0000 mL | Freq: Once | INTRAMUSCULAR | Status: AC
  Administered 2023-04-02: 10 mL

## 2023-04-02 MED ORDER — LIDOCAINE-EPINEPHRINE (PF) 1 %-1:200000 IJ SOLN
10.0000 mL | Freq: Once | INTRAMUSCULAR | Status: AC
  Administered 2023-04-02: 10 mL via INTRADERMAL

## 2023-04-02 MED ORDER — LIDOCAINE-EPINEPHRINE (PF) 1 %-1:200000 IJ SOLN
INTRAMUSCULAR | Status: AC
  Filled 2023-04-02: qty 30

## 2023-04-02 MED ORDER — LIDOCAINE HCL (PF) 2 % IJ SOLN
INTRAMUSCULAR | Status: AC
  Filled 2023-04-02: qty 10

## 2023-04-02 NOTE — Progress Notes (Signed)
PT tolerated left breast aspiration well today with NAD noted. PT verbalized understanding of discharge instructions. PT ambulatory at departure with work note provided.

## 2023-04-22 ENCOUNTER — Ambulatory Visit: Payer: Medicaid Other | Admitting: Adult Health

## 2023-04-22 ENCOUNTER — Encounter: Payer: Self-pay | Admitting: Adult Health

## 2023-04-22 ENCOUNTER — Other Ambulatory Visit (HOSPITAL_COMMUNITY)
Admission: RE | Admit: 2023-04-22 | Discharge: 2023-04-22 | Disposition: A | Discharge status: Home / Self Care | Source: Ambulatory Visit | Attending: Adult Health | Admitting: Adult Health

## 2023-04-22 VITALS — BP 121/76 | HR 64 | Ht 67.0 in | Wt 198.5 lb

## 2023-04-22 DIAGNOSIS — Z113 Encounter for screening for infections with a predominantly sexual mode of transmission: Secondary | ICD-10-CM | POA: Diagnosis present

## 2023-04-22 MED ORDER — AZITHROMYCIN 500 MG PO TABS
ORAL_TABLET | ORAL | 0 refills | Status: DC
Start: 2023-04-22 — End: 2023-05-22

## 2023-04-22 MED ORDER — VALACYCLOVIR HCL 1 G PO TABS: 1000.0000 mg | ORAL_TABLET | Freq: Two times a day (BID) | ORAL | 11 refills | Status: AC

## 2023-04-22 MED ORDER — DOXYCYCLINE HYCLATE 100 MG PO TABS: 100.0000 mg | ORAL_TABLET | Freq: Two times a day (BID) | ORAL | 0 refills | Status: DC

## 2023-04-22 MED ORDER — FLUCONAZOLE 150 MG PO TABS
ORAL_TABLET | ORAL | 1 refills | Status: DC
Start: 2023-04-22 — End: 2023-05-22

## 2023-04-22 NOTE — Progress Notes (Signed)
  Subjective:     Patient ID: [name], female   DOB: Aug 07, 1985, 38 y.o.   MRN: [id number]  HPI [name] is a 38 year old female,single, sp hysterectomy, in saying partner was treated for mycoplasma, and she wants to be treated and tested. She requests refill on valtrex too.   PCP is Dr Letitia Libra   Review of Systems Denies any discharge, itching or burning Reviewed past medical,surgical, social and family history. Reviewed medications and allergies.     Objective:   Physical Exam BP 121/76 (BP Location: Left Arm, Patient Position: Sitting, Cuff Size: Normal)   Pulse 64   Ht 5\' 7"  (1.702 m)   Wt 198 lb 8 oz (90 kg)   LMP 08/01/2014   BMI 31.09 kg/m     Skin warm and dry.Pelvic: external genitalia is normal in appearance no lesions, vagina: white discharge without odor,urethra has no lesions or masses noted, cervix and uterus are absent,adnexa: no masses or tenderness noted. Bladder is non tender and no masses felt. CV swab collected x 2  Fall risk is low  Upstream - 04/22/23 1120       Pregnancy Intention Screening   Does the patient want to become pregnant in the next year? N/A    Does the patient's partner want to become pregnant in the next year? N/A    Would the patient like to discuss contraceptive options today? N/A      Contraception Wrap Up   Current Method Female Sterilization   hyst   End Method Female Sterilization   hyst   Contraception Counseling Provided No            Examination chaperoned by Malachy Mood LPN  Assessment:     1. Screening examination for STD (sexually transmitted disease) (Primary) Partner treated for mycoplasma, swab sent CV swab sent for GC/CHL,trich,BV and yeast  Will rx doxycycline 100 mg 1 bid x 7 days and Azithromucin 500 mg 2 take 2 po now  She requests refill on valtrex and rx for diflucan Meds ordered this encounter  Medications   doxycycline (VIBRA-TABS) 100 MG tablet    Sig: Take 1 tablet (100 mg total) by mouth 2 (two)  times daily.    Dispense:  14 tablet    Refill:  0    Supervising Provider:   Duane Lope H [[id number]]   azithromycin (ZITHROMAX) 500 MG tablet    Sig: Take 2 po now    Dispense:  2 tablet    Refill:  0    Supervising Provider:   Duane Lope H [[id number]]   fluconazole (DIFLUCAN) 150 MG tablet    Sig: Take 1 now and 1 in 3 days    Dispense:  2 tablet    Refill:  1    Supervising Provider:   Duane Lope H [[id number]]   valACYclovir (VALTREX) 1000 MG tablet    Sig: Take 1 tablet (1,000 mg total) by mouth 2 (two) times daily.    Dispense:  10 tablet    Refill:  11    Supervising Provider:   Duane Lope H [[id number]]   No sex while taking meds  - Genital Mycoplasmas NAA, Swab - Cervicovaginal ancillary only( [place])     Plan:     Follow up prn

## 2023-04-23 LAB — CERVICOVAGINAL ANCILLARY ONLY
Bacterial Vaginitis (gardnerella): POSITIVE — AB
Candida Glabrata: NEGATIVE
Candida Vaginitis: POSITIVE — AB
Chlamydia: NEGATIVE
Comment: NEGATIVE
Comment: NEGATIVE
Comment: NEGATIVE
Comment: NEGATIVE
Comment: NEGATIVE
Comment: NORMAL
Neisseria Gonorrhea: NEGATIVE
Trichomonas: NEGATIVE

## 2023-04-24 LAB — GENITAL MYCOPLASMAS NAA, SWAB
Mycoplasma genitalium NAA: NEGATIVE
Mycoplasma hominis NAA: POSITIVE — AB
Ureaplasma spp NAA: POSITIVE — AB

## 2023-04-27 ENCOUNTER — Other Ambulatory Visit: Payer: Self-pay | Admitting: Adult Health

## 2023-04-27 ENCOUNTER — Encounter: Payer: Self-pay | Admitting: Adult Health

## 2023-04-27 DIAGNOSIS — A493 Mycoplasma infection, unspecified site: Secondary | ICD-10-CM | POA: Insufficient documentation

## 2023-04-27 MED ORDER — METRONIDAZOLE 500 MG PO TABS: 500.0000 mg | ORAL_TABLET | Freq: Two times a day (BID) | ORAL | 0 refills | Status: DC

## 2023-04-27 NOTE — Progress Notes (Signed)
+  BV rx flagyl 

## 2023-05-22 ENCOUNTER — Encounter: Payer: Self-pay | Admitting: Adult Health

## 2023-05-22 ENCOUNTER — Ambulatory Visit: Admitting: Adult Health

## 2023-05-22 ENCOUNTER — Other Ambulatory Visit (HOSPITAL_COMMUNITY)
Admission: RE | Admit: 2023-05-22 | Discharge: 2023-05-22 | Disposition: A | Discharge status: Home / Self Care | Source: Ambulatory Visit | Attending: Adult Health | Admitting: Adult Health

## 2023-05-22 VITALS — BP 122/82 | HR 69 | Ht 67.0 in | Wt 197.0 lb

## 2023-05-22 DIAGNOSIS — Z113 Encounter for screening for infections with a predominantly sexual mode of transmission: Secondary | ICD-10-CM | POA: Diagnosis present

## 2023-05-22 DIAGNOSIS — B9689 Other specified bacterial agents as the cause of diseases classified elsewhere: Secondary | ICD-10-CM | POA: Diagnosis not present

## 2023-05-22 DIAGNOSIS — A493 Mycoplasma infection, unspecified site: Secondary | ICD-10-CM

## 2023-05-22 DIAGNOSIS — N760 Acute vaginitis: Secondary | ICD-10-CM | POA: Diagnosis not present

## 2023-05-22 NOTE — Progress Notes (Signed)
  Subjective:     Patient ID: [name], female   DOB: Dec 15, 1985, 38 y.o.   MRN: [id number]  HPI [name] is a 38 year old female,single, sp hysterectomy if for proof of cure after being treated for mycoplasma and ureaplasma. She was also treated for BV and yeast, and has noticed a vaginal discharge.   PCP is Dr Letitia Libra   Review of Systems +vaginal discharge Reviewed past medical,surgical, social and family history. Reviewed medications and allergies.     Objective:   Physical Exam BP 122/82 (BP Location: Right Arm, Patient Position: Sitting, Cuff Size: Normal)   Pulse 69   Ht 5\' 7"  (1.702 m)   Wt 197 lb (89.4 kg)   LMP 08/01/2014   BMI 30.85 kg/m     Skin warm and dry.Pelvic: external genitalia is normal in appearance no lesions, vagina: white discharge without odor,urethra has no lesions or masses noted, cervix and uterus are absent, adnexa: no masses or tenderness noted. Bladder is non tender and no masses felt. CV swab x 2 obtained.  Upstream - 05/22/23 1124       Pregnancy Intention Screening   Does the patient want to become pregnant in the next year? N/A    Does the patient's partner want to become pregnant in the next year? N/A    Would the patient like to discuss contraceptive options today? N/A      Contraception Wrap Up   Current Method Female Sterilization   hyst   End Method Female Sterilization   hyst   Contraception Counseling Provided No            Examination chaperoned by Malachy Mood LPN  Assessment:     1. Screen for STD (sexually transmitted disease) CV swab sent for GC/CHL,trich,BV and yeast  - Cervicovaginal ancillary only( [place])  2. 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

## 2023-05-25 ENCOUNTER — Other Ambulatory Visit: Payer: Self-pay | Admitting: Adult Health

## 2023-05-25 LAB — CERVICOVAGINAL ANCILLARY ONLY
Bacterial Vaginitis (gardnerella): POSITIVE — AB
Candida Glabrata: NEGATIVE
Candida Vaginitis: NEGATIVE
Chlamydia: NEGATIVE
Comment: NEGATIVE
Comment: NEGATIVE
Comment: NEGATIVE
Comment: NEGATIVE
Comment: NEGATIVE
Comment: NORMAL
Neisseria Gonorrhea: NEGATIVE
Trichomonas: NEGATIVE

## 2023-05-25 LAB — GENITAL MYCOPLASMAS NAA, SWAB: Mycoplasma genitalium NAA: NEGATIVE

## 2023-05-25 LAB — SPECIMEN STATUS REPORT

## 2023-05-25 MED ORDER — DOXYCYCLINE HYCLATE 100 MG PO TABS: 100.0000 mg | ORAL_TABLET | Freq: Two times a day (BID) | ORAL | 0 refills | Status: AC

## 2023-05-25 MED ORDER — METRONIDAZOLE 500 MG PO TABS: 500.0000 mg | ORAL_TABLET | Freq: Two times a day (BID) | ORAL | 0 refills | Status: AC

## 2023-06-09 ENCOUNTER — Encounter (HOSPITAL_COMMUNITY): Payer: Self-pay

## 2023-06-09 ENCOUNTER — Emergency Department (HOSPITAL_COMMUNITY)

## 2023-06-09 ENCOUNTER — Other Ambulatory Visit: Payer: Self-pay

## 2023-06-09 ENCOUNTER — Emergency Department (HOSPITAL_COMMUNITY)
Admission: EM | Admit: 2023-06-09 | Discharge: 2023-06-09 | Disposition: A | Discharge status: Home / Self Care | Attending: Emergency Medicine | Admitting: Emergency Medicine

## 2023-06-09 DIAGNOSIS — J0100 Acute maxillary sinusitis, unspecified: Secondary | ICD-10-CM | POA: Diagnosis not present

## 2023-06-09 DIAGNOSIS — Z91040 Latex allergy status: Secondary | ICD-10-CM | POA: Insufficient documentation

## 2023-06-09 DIAGNOSIS — R220 Localized swelling, mass and lump, head: Secondary | ICD-10-CM | POA: Diagnosis present

## 2023-06-09 DIAGNOSIS — K047 Periapical abscess without sinus: Secondary | ICD-10-CM | POA: Insufficient documentation

## 2023-06-09 LAB — CBC WITH DIFFERENTIAL/PLATELET
Abs Immature Granulocytes: 0.01 10*3/uL (ref 0.00–0.07)
Basophils Absolute: 0 10*3/uL (ref 0.0–0.1)
Basophils Relative: 0 %
Eosinophils Absolute: 0 10*3/uL (ref 0.0–0.5)
Eosinophils Relative: 0 %
HCT: 40.6 % (ref 36.0–46.0)
Hemoglobin: 13.3 g/dL (ref 12.0–15.0)
Immature Granulocytes: 0 %
Lymphocytes Relative: 25 %
Lymphs Abs: 1.7 10*3/uL (ref 0.7–4.0)
MCH: 30.7 pg (ref 26.0–34.0)
MCHC: 32.8 g/dL (ref 30.0–36.0)
MCV: 93.8 fL (ref 80.0–100.0)
Monocytes Absolute: 0.6 10*3/uL (ref 0.1–1.0)
Monocytes Relative: 9 %
Neutro Abs: 4.4 10*3/uL (ref 1.7–7.7)
Neutrophils Relative %: 66 %
Platelets: 339 10*3/uL (ref 150–400)
RBC: 4.33 MIL/uL (ref 3.87–5.11)
RDW: 13.1 % (ref 11.5–15.5)
WBC: 6.8 10*3/uL (ref 4.0–10.5)
nRBC: 0 % (ref 0.0–0.2)

## 2023-06-09 LAB — COMPREHENSIVE METABOLIC PANEL WITH GFR
ALT: 31 U/L (ref 0–44)
AST: 21 U/L (ref 15–41)
Albumin: 3.9 g/dL (ref 3.5–5.0)
Alkaline Phosphatase: 85 U/L (ref 38–126)
Anion gap: 10 (ref 5–15)
BUN: 11 mg/dL (ref 6–20)
CO2: 25 mmol/L (ref 22–32)
Calcium: 8.9 mg/dL (ref 8.9–10.3)
Chloride: 101 mmol/L (ref 98–111)
Creatinine, Ser: 0.66 mg/dL (ref 0.44–1.00)
GFR, Estimated: >60 mL/min (ref >60)
Glucose, Bld: 105 mg/dL — ABNORMAL HIGH (ref 70–99)
Potassium: 4 mmol/L (ref 3.5–5.1)
Sodium: 136 mmol/L (ref 135–145)
Total Bilirubin: 0.5 mg/dL (ref 0.0–1.2)
Total Protein: 7.7 g/dL (ref 6.5–8.1)

## 2023-06-09 LAB — ACETAMINOPHEN LEVEL: Acetaminophen (Tylenol), Serum: <10 ug/mL — ABNORMAL LOW (ref 10–30)

## 2023-06-09 LAB — HCG, SERUM, QUALITATIVE: Preg, Serum: NEGATIVE

## 2023-06-09 MED ORDER — KETOROLAC TROMETHAMINE 15 MG/ML IJ SOLN
15.0000 mg | Freq: Once | INTRAMUSCULAR | Status: AC
  Administered 2023-06-09: 15 mg via INTRAVENOUS
  Filled 2023-06-09: qty 1

## 2023-06-09 MED ORDER — NAPROXEN 500 MG PO TABS: 500.0000 mg | ORAL_TABLET | Freq: Two times a day (BID) | ORAL | 0 refills | Status: AC

## 2023-06-09 MED ORDER — AMOXICILLIN-POT CLAVULANATE 875-125 MG PO TABS: 1.0000 | ORAL_TABLET | Freq: Two times a day (BID) | ORAL | 0 refills | Status: AC

## 2023-06-09 MED ORDER — IOHEXOL 300 MG/ML  SOLN
75.0000 mL | Freq: Once | INTRAMUSCULAR | Status: AC | PRN
  Administered 2023-06-09: 75 mL via INTRAVENOUS

## 2023-06-09 NOTE — Discharge Instructions (Signed)
 Please follow-up closely with a dentist on an outpatient basis.  Take all antibiotics as directed.  Return to emergency department immediately for any new or worsening symptoms.

## 2023-06-09 NOTE — ED Triage Notes (Signed)
 Patient stated Friday began to have a headache, woke up Saturday to Ear pressure, Had been taking an antibiotic and took tylenol . Sunday Left side facial pain and pressure, Patient stated she took 6-500 mg tylenol  all at once then 6 hrs after took 2 ibuprofen  and a nausea medication. Yesterday reports having facial droop along with numbness to left side of face. Took benadryl  last night and last does this morning at 2:30 am. Patient does report blurry vision.

## 2023-06-09 NOTE — ED Provider Notes (Signed)
 [place] EMERGENCY DEPARTMENT AT [facility] Provider Note   CSN: [id number] Arrival date & time: 06/09/23  8295     History  Chief Complaint  Patient presents with   Facial Swelling   Facial Pain    [name] is a 38 y.o. female.  Patient is a 38 year old female who presents to the emergency department with a chief complaint of left-sided facial swelling, headache which has been ongoing for approximate the past 3 days.  Patient notes that she has felt as though the left side of her face was drooping yesterday.  Patient notes that she has recently been on azithromycin  as well as doxycycline .  Patient notes that she has been taking over-the-counter pain medication with no improvement in her symptoms.  She also states that she feels as though she is having some blurry vision in her left eye.  She denies any associated dental pain, stridor, dysphagia, drooling, dyspnea  She does state that she is experiencing pain and fullness to her left ear.        Home Medications Prior to Admission medications   Medication Sig Start Date End Date Taking? Authorizing Provider  doxycycline  (VIBRA -TABS) 100 MG tablet Take 1 tablet (100 mg total) by mouth 2 (two) times daily. 05/25/23   Javan Messing, NP  metroNIDAZOLE  (FLAGYL ) 500 MG tablet Take 1 tablet (500 mg total) by mouth 2 (two) times daily. 05/25/23   Javan Messing, NP  acetaminophen  (TYLENOL ) 325 MG tablet Take 650 mg by mouth every 6 (six) hours as needed. 10/13/19   [provider]  valACYclovir  (VALTREX ) 1000 MG tablet Take 1 tablet (1,000 mg total) by mouth 2 (two) times daily. 04/22/23   Javan Messing, NP      Allergies    Alprazolam and Latex    Review of Systems   Review of Systems  HENT:         Left-sided facial swelling  Neurological:  Positive for headaches.  All other systems reviewed and are negative.   Physical Exam Updated Vital Signs BP (!) 134/101 (BP Location: Left Arm)    Pulse 90   Temp 98.2 F (36.8 C) (Oral)   Resp 16   Ht 5\' 6"  (1.676 m)   Wt 89.4 kg   LMP 08/01/2014   SpO2 99%   BMI 31.80 kg/m  Physical Exam Vitals and nursing note reviewed.  Constitutional:      Appearance: Normal appearance.  HENT:     Head: Normocephalic and atraumatic.     Comments: Swelling to the left maxillary region    Right Ear: Tympanic membrane, ear canal and external ear normal.     Left Ear: Tympanic membrane, ear canal and external ear normal.     Nose: Nose normal. No congestion or rhinorrhea.     Mouth/Throat:     Mouth: Mucous membranes are moist.     Pharynx: No oropharyngeal exudate or posterior oropharyngeal erythema.     Comments: Teeth unremarkable, no peritonsillar swelling, floor mouth is soft, tolerates creations without difficulty, no swelling of the gum lines Eyes:     Extraocular Movements: Extraocular movements intact.     Conjunctiva/sclera: Conjunctivae normal.     Pupils: Pupils are equal, round, and reactive to light.  Cardiovascular:     Rate and Rhythm: Normal rate and regular rhythm.     Pulses: Normal pulses.     Heart sounds: Normal heart sounds. No murmur heard.    No gallop.  Pulmonary:     Effort: Pulmonary effort is normal. No respiratory distress.     Breath sounds: Normal breath sounds. No stridor. No wheezing, rhonchi or rales.  Musculoskeletal:        General: No swelling. Normal range of motion.     Cervical back: Normal range of motion and neck supple.  Skin:    General: Skin is warm and dry.  Neurological:     General: No focal deficit present.     Mental Status: She is alert and oriented to person, place, and time. Mental status is at baseline.     Cranial Nerves: No cranial nerve deficit.     Sensory: No sensory deficit.     Motor: No weakness.     Coordination: Coordination normal.     Gait: Gait normal.  Psychiatric:        Mood and Affect: Mood normal.        Behavior: Behavior normal.        Thought  Content: Thought content normal.        Judgment: Judgment normal.     ED Results / Procedures / Treatments   Labs (all labs ordered are listed, but only abnormal results are displayed) Labs Reviewed  COMPREHENSIVE METABOLIC PANEL WITH GFR  CBC WITH DIFFERENTIAL/PLATELET  HCG, SERUM, QUALITATIVE  ACETAMINOPHEN  LEVEL    EKG None  Radiology No results found.  Procedures Procedures    Medications Ordered in ED Medications  ketorolac  (TORADOL ) 15 MG/ML injection 15 mg (has no administration in time range)    ED Course/ Medical Decision Making/ A&P                                 Medical Decision Making Amount and/or Complexity of Data Reviewed Labs: ordered. Radiology: ordered.  Risk Prescription drug management.   This patient presents to the ED for concern of left-sided facial swelling, headache differential diagnosis includes sinusitis, dental infection, otitis media, otitis externa, facial cellulitis or abscess    Additional history obtained:  Additional history obtained from none External records from outside source obtained and reviewed including none   Lab Tests:  I Ordered, and personally interpreted labs.  The pertinent results include: No leukocytosis or anemia, normal electrolytes, normal kidney function liver function   Imaging Studies ordered:  I ordered imaging studies including CT scan of head and maxillofacial region I independently visualized and interpreted imaging which showed no acute intracranial abnormality, left maxillary sinus disease, left periapical abscess I agree with the radiologist interpretation   Medicines ordered and prescription drug management:  I ordered medication including Augmentin , naproxen  for sinusitis, periapical abscess Reevaluation of the patient after these medicines showed that the patient improved I have reviewed the patients home medicines and have made adjustments as needed   Problem List / ED  Course:  Patient is doing well at this time and is stable for discharge home.  Discussed with patient that CT scan findings are consistent with moderate to severe left maxillary sinus disease as well as periapical abscess.  Patient has no signs of acute peritonsillar abscess, Ludwig's angina, retropharyngeal abscess, epiglottitis.  She has no signs of acute airway compromise at this point.  Patient does not meet sepsis criteria.  She has no indication for orbital or orbital cellulitis.  Will cover patient with the appropriate antibiotics at this time and recommend close follow-up with a dentist on an outpatient basis.  Strict return precautions were discussed for any new or worsening symptoms.  Patient voiced understanding and had no additional questions.   Social Determinants of Health:  None           Final Clinical Impression(s) / ED Diagnoses Final diagnoses:  None    Rx / DC Orders ED Discharge Orders     None         Emmalene Hare 06/09/23 1300    Iva Mariner, MD 06/09/23 1701

## 2023-06-29 ENCOUNTER — Encounter (HOSPITAL_COMMUNITY): Payer: Self-pay

## 2023-06-29 ENCOUNTER — Emergency Department (HOSPITAL_COMMUNITY)
Admission: EM | Admit: 2023-06-29 | Discharge: 2023-06-29 | Disposition: A | Discharge status: Home / Self Care | Attending: Emergency Medicine | Admitting: Emergency Medicine

## 2023-06-29 ENCOUNTER — Other Ambulatory Visit: Payer: Self-pay

## 2023-06-29 DIAGNOSIS — T7840XA Allergy, unspecified, initial encounter: Secondary | ICD-10-CM | POA: Insufficient documentation

## 2023-06-29 DIAGNOSIS — Z91040 Latex allergy status: Secondary | ICD-10-CM | POA: Insufficient documentation

## 2023-06-29 MED ORDER — FAMOTIDINE 20 MG PO TABS
20.0000 mg | ORAL_TABLET | Freq: Once | ORAL | Status: AC
  Administered 2023-06-29: 20 mg via ORAL
  Filled 2023-06-29: qty 1

## 2023-06-29 MED ORDER — DIPHENHYDRAMINE HCL 25 MG PO TABS: 25.0000 mg | ORAL_TABLET | ORAL | 0 refills | Status: AC | PRN

## 2023-06-29 MED ORDER — DIPHENHYDRAMINE HCL 25 MG PO CAPS
25.0000 mg | ORAL_CAPSULE | Freq: Once | ORAL | Status: AC
  Administered 2023-06-29: 25 mg via ORAL
  Filled 2023-06-29: qty 1

## 2023-06-29 MED ORDER — PREDNISONE 10 MG PO TABS: ORAL_TABLET | ORAL | 0 refills | Status: AC

## 2023-06-29 MED ORDER — DEXAMETHASONE SODIUM PHOSPHATE 10 MG/ML IJ SOLN
10.0000 mg | Freq: Once | INTRAMUSCULAR | Status: AC
  Administered 2023-06-29: 10 mg via INTRAMUSCULAR
  Filled 2023-06-29: qty 1

## 2023-06-29 NOTE — ED Provider Notes (Signed)
 [place] EMERGENCY DEPARTMENT AT [facility] Provider Note   CSN: [id number] Arrival date & time: 06/29/23  0900     History  Chief Complaint  Patient presents with   Allergic Reaction    [name] is a 38 y.o. female.   Allergic Reaction Presenting symptoms: rash   Presenting symptoms: no difficulty swallowing         [name] is a 38 y.o. female who presents to the Emergency Department complaining of red rash and itching of her right wrist with swelling of her right upper eyelid.  She noticed the symptoms this morning while showering.  Denies any swelling of her lips or tongue or difficulty swallowing.  No shortness of breath, chest pain or other rashes.  No history of anaphylactic reactions.  Does recall using a different soap yesterday at a restaurant, but does not recall any other new exposures to detergents or chemicals.  No known insect bites.  Completed a course of Augmentin  one week ago.    Home Medications Prior to Admission medications   Medication Sig Start Date End Date Taking? Authorizing Provider  diphenhydrAMINE  (BENADRYL ) 25 MG tablet Take 1 tablet (25 mg total) by mouth every 4 (four) hours as needed. 06/29/23  Yes Roby Spalla, PA-C  doxycycline  (VIBRA -TABS) 100 MG tablet Take 1 tablet (100 mg total) by mouth 2 (two) times daily. 05/25/23   Javan Messing, NP  metroNIDAZOLE  (FLAGYL ) 500 MG tablet Take 1 tablet (500 mg total) by mouth 2 (two) times daily. 05/25/23   Javan Messing, NP  predniSONE (DELTASONE) 10 MG tablet Take 6 tablets day one, 5 tablets day two, 4 tablets day three, 3 tablets day four, 2 tablets day five, then 1 tablet day six 06/29/23  Yes Alexiz Sustaita, PA-C  acetaminophen  (TYLENOL ) 325 MG tablet Take 650 mg by mouth every 6 (six) hours as needed. 10/13/19   [provider]  naproxen  (NAPROSYN ) 500 MG tablet Take 1 tablet (500 mg total) by mouth 2 (two) times daily. 06/09/23   Roselynn Connors, PA-C   valACYclovir  (VALTREX ) 1000 MG tablet Take 1 tablet (1,000 mg total) by mouth 2 (two) times daily. 04/22/23   Javan Messing, NP      Allergies    Alprazolam and Latex    Review of Systems   Review of Systems  Constitutional:  Negative for chills and fever.  HENT:  Negative for congestion, facial swelling, hearing loss, sneezing, sore throat, trouble swallowing and voice change.   Eyes:  Negative for pain, discharge, redness and itching.       Swelling right upper eyelid only  Respiratory:  Negative for cough, choking and shortness of breath.   Cardiovascular:  Negative for chest pain.  Gastrointestinal:  Negative for abdominal pain, diarrhea, nausea and vomiting.  Skin:  Positive for rash.  Neurological:  Negative for dizziness, weakness and headaches.  Psychiatric/Behavioral:  Negative for confusion.     Physical Exam Updated Vital Signs BP (!) 125/53 (BP Location: Left Arm)   Pulse 72   Temp 98 F (36.7 C) (Oral)   Resp 16   Ht 5\' 6"  (1.676 m)   Wt 89.4 kg   LMP 08/01/2014   SpO2 98%   BMI 31.80 kg/m  Physical Exam Vitals and nursing note reviewed.  Constitutional:      General: She is not in acute distress.    Appearance: Normal appearance. She is not ill-appearing or toxic-appearing.  HENT:  Mouth/Throat:     Mouth: Mucous membranes are moist.     Pharynx: Oropharynx is clear. No oropharyngeal exudate or posterior oropharyngeal erythema.     Comments: No erythema, edema.  Uvula is midline non edematous Eyes:     General: Vision grossly intact.        Right eye: No foreign body, discharge or hordeolum.     Extraocular Movements: Extraocular movements intact.     Conjunctiva/sclera: Conjunctivae normal.     Right eye: Right conjunctiva is not injected. No chemosis or exudate.    Pupils: Pupils are equal, round, and reactive to light.     Comments: Mild edema localized to the right upper eyelid only.  No facial erythema, no periorbital swelling.     Cardiovascular:     Rate and Rhythm: Normal rate and regular rhythm.     Pulses: Normal pulses.  Pulmonary:     Effort: Pulmonary effort is normal. No respiratory distress.     Breath sounds: No wheezing, rhonchi or rales.  Abdominal:     Palpations: Abdomen is soft.     Tenderness: There is no abdominal tenderness.  Musculoskeletal:        General: Normal range of motion.     Cervical back: Normal range of motion. No rigidity or tenderness.  Lymphadenopathy:     Cervical: No cervical adenopathy.  Skin:    General: Skin is warm.     Capillary Refill: Capillary refill takes less than 2 seconds.     Findings: Rash present.     Comments: Localized area of erythema to the right wrist.  Few small welts to this area as well.  No significant edema.  Fingers area non edematous  Neurological:     General: No focal deficit present.     Mental Status: She is alert.     Sensory: No sensory deficit.     Motor: No weakness.     ED Results / Procedures / Treatments   Labs (all labs ordered are listed, but only abnormal results are displayed) Labs Reviewed - No data to display  EKG None  Radiology No results found.  Procedures Procedures    Medications Ordered in ED Medications  diphenhydrAMINE  (BENADRYL ) capsule 25 mg (25 mg Oral Given 06/29/23 0925)  dexamethasone  (DECADRON ) injection 10 mg (10 mg Intramuscular Given 06/29/23 0925)  famotidine (PEPCID) tablet 20 mg (20 mg Oral Given 06/29/23 2956)    ED Course/ Medical Decision Making/ A&P                                 Medical Decision Making Patient here for evaluation of likely allergic reaction versus contact dermatitis.  Woke this morning noticed itching of her right wrist with few welts to the area and localized swelling of her right upper eyelid.  No visual changes headache or dizziness.  Possible exposure to a different soap used at a restaurant yesterday but otherwise cannot recall any other contact exposures or new  medications.  Did complete a course of Augmentin  1 week ago without previous difficulty.  On exam, patient well-appearing nontoxic.  Airway is patent no respiratory distress noted.  Symptoms are localized to right upper eyelid and right wrist only.  I suspect this is likely a contact dermatitis.  I feel it is less likely secondary reaction to the Augmentin  as she completed this a week ago without difficulty.  No concerning symptoms for anaphylaxis  or orbital cellulitis although considered.  Amount and/or Complexity of Data Reviewed Discussion of management or test interpretation with external provider(s): Patient has been observed in the department without further complication.  P.o. Benadryl , Pepcid and IM Decadron  given here.  Patient resting comfortably.  Vital signs reassuring.  Swelling is localized to the right upper eyelid.  There is no periorbital tenderness redness or swelling.  No indication for imaging or labs at this time.  I feel the patient is appropriate for discharge home, she will continue with Benadryl  and steroid taper was prescribed.  Have recommended cool compresses on and off to her eye to help with swelling.  She was given strict return precautions and agrees to plan.  Risk OTC drugs. Prescription drug management.           Final Clinical Impression(s) / ED Diagnoses Final diagnoses:  Allergic reaction, initial encounter    Rx / DC Orders ED Discharge Orders          Ordered    predniSONE (DELTASONE) 10 MG tablet        06/29/23 1010    diphenhydrAMINE  (BENADRYL ) 25 MG tablet  Every 4 hours PRN        06/29/23 1010              Catherne Clubs, PA-C 06/29/23 1034    Mozell Arias, MD 06/29/23 1535

## 2023-06-29 NOTE — ED Notes (Signed)
 Pt given a cool rag for her eye at this time.

## 2023-06-29 NOTE — Discharge Instructions (Signed)
 Cool compresses on and off to your face.  Take the Benadryl  as directed starting later today.  You may start the prednisone prescription tomorrow.  Follow-up with your primary care provider for recheck.  Return to the emergency department if you develop any new or worsening symptoms such as continued swelling of your face, or swelling of your lips or tongue, shortness of breath or difficulty swallowing.

## 2023-06-29 NOTE — ED Triage Notes (Signed)
 Pt c/o rt eye lid swelling and rt wrist redness and swelling. Two small bites noted to rt wrist. No evidence of bite noted to eyelid. Pt states she was in the shower and noticed her eye swelling. Pt denies any new skin care products or anything new that would cause reaction.

## 2023-12-13 ENCOUNTER — Emergency Department (HOSPITAL_COMMUNITY)

## 2023-12-13 ENCOUNTER — Other Ambulatory Visit: Payer: Self-pay

## 2023-12-13 ENCOUNTER — Emergency Department (HOSPITAL_COMMUNITY)
Admission: EM | Admit: 2023-12-13 | Discharge: 2023-12-13 | Disposition: A | Discharge status: Home / Self Care | Attending: Emergency Medicine | Admitting: Emergency Medicine

## 2023-12-13 ENCOUNTER — Encounter (HOSPITAL_COMMUNITY): Payer: Self-pay

## 2023-12-13 DIAGNOSIS — Z91040 Latex allergy status: Secondary | ICD-10-CM | POA: Insufficient documentation

## 2023-12-13 DIAGNOSIS — R0981 Nasal congestion: Secondary | ICD-10-CM | POA: Diagnosis not present

## 2023-12-13 DIAGNOSIS — J069 Acute upper respiratory infection, unspecified: Secondary | ICD-10-CM

## 2023-12-13 DIAGNOSIS — R059 Cough, unspecified: Secondary | ICD-10-CM | POA: Insufficient documentation

## 2023-12-13 LAB — RESP PANEL BY RT-PCR (RSV, FLU A&B, COVID)  RVPGX2
Influenza A by PCR: NEGATIVE
Influenza B by PCR: NEGATIVE
Resp Syncytial Virus by PCR: NEGATIVE
SARS Coronavirus 2 by RT PCR: NEGATIVE

## 2023-12-13 MED ORDER — BENZONATATE 100 MG PO CAPS
200.0000 mg | ORAL_CAPSULE | Freq: Once | ORAL | Status: AC
  Administered 2023-12-13: 200 mg via ORAL
  Filled 2023-12-13: qty 2

## 2023-12-13 MED ORDER — FLUTICASONE PROPIONATE 50 MCG/ACT NA SUSP: 2.0000 | Freq: Every day | NASAL | 0 refills | Status: AC

## 2023-12-13 MED ORDER — BENZONATATE 100 MG PO CAPS: 100.0000 mg | ORAL_CAPSULE | Freq: Three times a day (TID) | ORAL | 0 refills | Status: AC

## 2023-12-13 MED ORDER — KETOROLAC TROMETHAMINE 15 MG/ML IJ SOLN
15.0000 mg | Freq: Once | INTRAMUSCULAR | Status: AC
  Administered 2023-12-13: 15 mg via INTRAMUSCULAR
  Filled 2023-12-13: qty 1

## 2023-12-13 NOTE — Discharge Instructions (Addendum)
 You were seen in the emergency room today for cough, congestion and bodyaches.  You were negative for COVID flu and RSV and your chest x-ray is normal, your vital signs are reassuring.  Symptoms are mostly due to a viral illness, we are treating you with a cough suppressant and nasal spray to help with your symptoms.  Drink plenty of fluids, rest, come back if you have increased symptoms such as shortness of breath, fever, persistent vomiting or other worrisome changes.

## 2023-12-13 NOTE — ED Triage Notes (Signed)
 Pt from home complains of body aches, runny nose, back pain when breathing, and chills. Also endorses loss of appetite and non-productive cough. Symptoms started Friday evening. Pt AAOx4, ambulatory. Pt endorses SOB and CP

## 2023-12-13 NOTE — ED Provider Notes (Signed)
 [place] EMERGENCY DEPARTMENT AT [facility] Provider Note   CSN: [id number] Arrival date & time: 12/13/23  1155     Patient presents with: Fatigue and Generalized Body Aches   [name] is a 38 y.o. female.  She has history of migraines.  Presents to the ER today for evaluation of cough, body aches, congestion.  She reports her symptoms started 2 days ago.  She has been taking DayQuil during the day and Benadryl  at night.  She states these are minimally effective.  States when she wakes in the morning she blows her nose and there is yellow mucus.  Reports a lot of pressure in her sinuses as well.  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  She denies any fever.  She states she has chest pain only with coughing.  She denies shortness of breath.  She denies lower extremity swelling or pain.   HPI     Prior to Admission medications   Medication Sig Start Date End Date Taking? Authorizing Provider  doxycycline  (VIBRA -TABS) 100 MG tablet Take 1 tablet (100 mg total) by mouth 2 (two) times daily. 05/25/23   Signa Delon LABOR, NP  metroNIDAZOLE  (FLAGYL ) 500 MG tablet Take 1 tablet (500 mg total) by mouth 2 (two) times daily. 05/25/23   Signa Delon LABOR, NP  acetaminophen  (TYLENOL ) 325 MG tablet Take 650 mg by mouth every 6 (six) hours as needed. 10/13/19   [provider]  diphenhydrAMINE  (BENADRYL ) 25 MG tablet Take 1 tablet (25 mg total) by mouth every 4 (four) hours as needed. 06/29/23   Triplett, Tammy, PA-C  naproxen  (NAPROSYN ) 500 MG tablet Take 1 tablet (500 mg total) by mouth 2 (two) times daily. 06/09/23   Daralene Lonni BIRCH, PA-C  predniSONE  (DELTASONE ) 10 MG tablet Take 6 tablets day one, 5 tablets day two, 4 tablets day three, 3 tablets day four, 2 tablets day five, then 1 tablet day six 06/29/23   Triplett, Tammy, PA-C  valACYclovir  (VALTREX ) 1000 MG tablet Take 1 tablet (1,000 mg total)  by mouth 2 (two) times daily. 04/22/23   Signa Delon LABOR, NP    Allergies: Alprazolam and Latex    Review of Systems  Updated Vital Signs BP 131/88   Pulse 84   Temp 98.7 F (37.1 C) (Oral)   Resp 18   Ht 5' 7 (1.702 m)   Wt 97.5 kg   LMP 08/01/2014   SpO2 99%   BMI 33.67 kg/m   Physical Exam Vitals and nursing note reviewed.  Constitutional:      General: She is not in acute distress.    Appearance: She is well-developed.  HENT:     Head: Normocephalic and atraumatic.     Mouth/Throat:     Mouth: Mucous membranes are moist.     Pharynx: Oropharynx is clear. No oropharyngeal exudate or posterior oropharyngeal erythema.  Eyes:     Conjunctiva/sclera: Conjunctivae normal.     Pupils: Pupils are equal, round, and reactive to light.  Cardiovascular:     Rate and Rhythm: Normal rate and regular rhythm.     Heart sounds: No murmur heard. Pulmonary:     Effort: Pulmonary effort is normal. No respiratory distress.     Breath sounds: Normal breath sounds.     Comments: Coarse breath sounds bilaterally Abdominal:     Palpations: Abdomen is soft.     Tenderness: There is  no abdominal tenderness.  Musculoskeletal:        General: No swelling.     Cervical back: Normal range of motion and neck supple. No rigidity.     Right lower leg: No edema.     Left lower leg: No edema.  Lymphadenopathy:     Cervical: Cervical adenopathy present.  Skin:    General: Skin is warm and dry.     Capillary Refill: Capillary refill takes less than 2 seconds.  Neurological:     General: No focal deficit present.     Mental Status: She is alert and oriented to person, place, and time.  Psychiatric:        Mood and Affect: Mood normal.     (all labs ordered are listed, but only abnormal results are displayed) Labs Reviewed  RESP PANEL BY RT-PCR (RSV, FLU A&B, COVID)  [id number]    EKG: None  Radiology: No results found.   Procedures   Medications Ordered in the ED - No data to  display                                  Medical Decision Making Parental diagnosis includes but not limited to bronchitis, sinusitis, viral illness, pneumonia, other  ED course: Otherwise healthy 38 year old female presents the ER for body aches, cough, nasal congestion with sinus pressure that started 2 days ago.  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  Negative for COVID flu and RSV, she denies shortness of breath but states she has pain all over her chest and upper back when she coughs.  No exertional pain, no lower extremity swelling or pain suggestive of DVT, no other PE risk factors, she is not tachycardic, tachypneic or hypoxic.  She is PERC negative so no need for workup for PE, likely viral illness, will get chest x-ray to rule out pneumonia, otherwise we will treat like viral illness with supportive care.  Amount and/or Complexity of Data Reviewed Radiology: ordered and independent interpretation performed.    Details: Chest x-ray shows no pulmonary edema or infiltrate.  No pneumothorax.  I agree with radiology read.  Risk Prescription drug management.        Final diagnoses:  None    ED Discharge Orders     None          Suellen Sherran DELENA DEVONNA 12/13/23 1415    Melvenia Motto, MD 12/13/23 1640
